# Patient Record
Sex: Female | Born: 1955 | Hispanic: No | Marital: Married
Health system: Southern US, Community
[De-identification: ages and names within clinical notes are randomized; demographics above are authoritative.]

## PROBLEM LIST (undated history)

## (undated) DIAGNOSIS — K760 Fatty (change of) liver, not elsewhere classified: Secondary | ICD-10-CM

## (undated) DIAGNOSIS — K3189 Other diseases of stomach and duodenum: Secondary | ICD-10-CM

## (undated) DIAGNOSIS — I5032 Chronic diastolic (congestive) heart failure: Secondary | ICD-10-CM

## (undated) DIAGNOSIS — J09X1 Influenza due to identified novel influenza A virus with pneumonia: Secondary | ICD-10-CM

## (undated) DIAGNOSIS — J9621 Acute and chronic respiratory failure with hypoxia: Secondary | ICD-10-CM

---

## 2018-09-28 ENCOUNTER — Inpatient Hospital Stay
Admit: 2018-09-28 | Discharge: 2018-10-26 | Disposition: A | Discharge status: Hospice | Payer: Medicare Other | Source: Other Acute Inpatient Hospital | Attending: Internal Medicine | Admitting: Internal Medicine

## 2018-09-28 ENCOUNTER — Other Ambulatory Visit (HOSPITAL_COMMUNITY): Payer: Medicare Other

## 2018-09-28 DIAGNOSIS — J09X1 Influenza due to identified novel influenza A virus with pneumonia: Secondary | ICD-10-CM | POA: Diagnosis present

## 2018-09-28 DIAGNOSIS — R195 Other fecal abnormalities: Secondary | ICD-10-CM

## 2018-09-28 DIAGNOSIS — K921 Melena: Secondary | ICD-10-CM

## 2018-09-28 DIAGNOSIS — K3189 Other diseases of stomach and duodenum: Secondary | ICD-10-CM | POA: Diagnosis present

## 2018-09-28 DIAGNOSIS — T17908A Unspecified foreign body in respiratory tract, part unspecified causing other injury, initial encounter: Secondary | ICD-10-CM

## 2018-09-28 DIAGNOSIS — Z931 Gastrostomy status: Secondary | ICD-10-CM

## 2018-09-28 DIAGNOSIS — Z930 Tracheostomy status: Secondary | ICD-10-CM

## 2018-09-28 DIAGNOSIS — Z992 Dependence on renal dialysis: Secondary | ICD-10-CM

## 2018-09-28 DIAGNOSIS — K760 Fatty (change of) liver, not elsewhere classified: Secondary | ICD-10-CM | POA: Diagnosis present

## 2018-09-28 DIAGNOSIS — I5032 Chronic diastolic (congestive) heart failure: Secondary | ICD-10-CM | POA: Diagnosis present

## 2018-09-28 DIAGNOSIS — R109 Unspecified abdominal pain: Secondary | ICD-10-CM

## 2018-09-28 DIAGNOSIS — D62 Acute posthemorrhagic anemia: Secondary | ICD-10-CM

## 2018-09-28 DIAGNOSIS — J9690 Respiratory failure, unspecified, unspecified whether with hypoxia or hypercapnia: Secondary | ICD-10-CM

## 2018-09-28 DIAGNOSIS — J9621 Acute and chronic respiratory failure with hypoxia: Secondary | ICD-10-CM | POA: Diagnosis present

## 2018-09-28 HISTORY — DX: Other diseases of stomach and duodenum: K31.89

## 2018-09-28 HISTORY — DX: Chronic diastolic (congestive) heart failure: I50.32

## 2018-09-28 HISTORY — DX: Acute and chronic respiratory failure with hypoxia: J96.21

## 2018-09-28 HISTORY — DX: Fatty (change of) liver, not elsewhere classified: K76.0

## 2018-09-28 HISTORY — DX: Influenza due to identified novel influenza A virus with pneumonia: J09.X1

## 2018-09-29 DIAGNOSIS — I5032 Chronic diastolic (congestive) heart failure: Secondary | ICD-10-CM | POA: Diagnosis not present

## 2018-09-29 DIAGNOSIS — R195 Other fecal abnormalities: Secondary | ICD-10-CM

## 2018-09-29 DIAGNOSIS — K3189 Other diseases of stomach and duodenum: Secondary | ICD-10-CM

## 2018-09-29 DIAGNOSIS — K760 Fatty (change of) liver, not elsewhere classified: Secondary | ICD-10-CM

## 2018-09-29 DIAGNOSIS — J09X1 Influenza due to identified novel influenza A virus with pneumonia: Secondary | ICD-10-CM

## 2018-09-29 DIAGNOSIS — D62 Acute posthemorrhagic anemia: Secondary | ICD-10-CM

## 2018-09-29 DIAGNOSIS — J9621 Acute and chronic respiratory failure with hypoxia: Secondary | ICD-10-CM

## 2018-09-29 DIAGNOSIS — K921 Melena: Secondary | ICD-10-CM

## 2018-09-29 LAB — CBC
HCT: 29.5 % — ABNORMAL LOW (ref 36.0–46.0)
HCT: 22.2 % — ABNORMAL LOW (ref 36.0–46.0)
Hemoglobin: 8.8 g/dL — ABNORMAL LOW (ref 12.0–15.0)
Hemoglobin: 6.7 g/dL — CL (ref 12.0–15.0)
MCH: 31.2 pg (ref 26.0–34.0)
MCH: 29.9 pg (ref 26.0–34.0)
MCHC: 29.8 g/dL — ABNORMAL LOW (ref 30.0–36.0)
MCHC: 30.2 g/dL (ref 30.0–36.0)
MCV: 104.6 fL — ABNORMAL HIGH (ref 80.0–100.0)
MCV: 99.1 fL (ref 80.0–100.0)
Platelets: 337 10*3/uL (ref 150–400)
Platelets: 361 10*3/uL (ref 150–400)
RBC: 2.82 MIL/uL — ABNORMAL LOW (ref 3.87–5.11)
RBC: 2.24 MIL/uL — ABNORMAL LOW (ref 3.87–5.11)
RDW: 20.7 % — ABNORMAL HIGH (ref 11.5–15.5)
RDW: 19.6 % — ABNORMAL HIGH (ref 11.5–15.5)
WBC: 21.4 10*3/uL — ABNORMAL HIGH (ref 4.0–10.5)
WBC: 16.4 10*3/uL — ABNORMAL HIGH (ref 4.0–10.5)
nRBC: 0.1 % (ref 0.0–0.2)
nRBC: 0 % (ref 0.0–0.2)

## 2018-09-29 LAB — APTT: aPTT: 30 seconds (ref 24–36)

## 2018-09-29 LAB — COMPREHENSIVE METABOLIC PANEL
ALT: 20 U/L (ref 0–44)
AST: 19 U/L (ref 15–41)
Albumin: 1.9 g/dL — ABNORMAL LOW (ref 3.5–5.0)
Alkaline Phosphatase: 336 U/L — ABNORMAL HIGH (ref 38–126)
Anion gap: 8 (ref 5–15)
BUN: 19 mg/dL (ref 8–23)
CO2: 26 mmol/L (ref 22–32)
Calcium: 8.1 mg/dL — ABNORMAL LOW (ref 8.9–10.3)
Chloride: 109 mmol/L (ref 98–111)
Creatinine, Ser: 0.65 mg/dL (ref 0.44–1.00)
GFR calc Af Amer: >60 mL/min (ref >60)
GFR calc non Af Amer: >60 mL/min (ref >60)
Glucose, Bld: 158 mg/dL — ABNORMAL HIGH (ref 70–99)
Potassium: 3.6 mmol/L (ref 3.5–5.1)
Sodium: 143 mmol/L (ref 135–145)
Total Bilirubin: 1.4 mg/dL — ABNORMAL HIGH (ref 0.3–1.2)
Total Protein: 5.3 g/dL — ABNORMAL LOW (ref 6.5–8.1)

## 2018-09-29 LAB — CBC WITH DIFFERENTIAL/PLATELET
Abs Immature Granulocytes: 0.51 10*3/uL — ABNORMAL HIGH (ref 0.00–0.07)
Basophils Absolute: 0 10*3/uL (ref 0.0–0.1)
Basophils Relative: 0 %
Eosinophils Absolute: 0.3 10*3/uL (ref 0.0–0.5)
Eosinophils Relative: 2 %
HCT: 22.4 % — ABNORMAL LOW (ref 36.0–46.0)
Hemoglobin: 7.1 g/dL — ABNORMAL LOW (ref 12.0–15.0)
Immature Granulocytes: 3 %
Lymphocytes Relative: 5 %
Lymphs Abs: 1 10*3/uL (ref 0.7–4.0)
MCH: 31.1 pg (ref 26.0–34.0)
MCHC: 31.7 g/dL (ref 30.0–36.0)
MCV: 98.2 fL (ref 80.0–100.0)
Monocytes Absolute: 1.6 10*3/uL — ABNORMAL HIGH (ref 0.1–1.0)
Monocytes Relative: 8 %
Neutro Abs: 16.4 10*3/uL — ABNORMAL HIGH (ref 1.7–7.7)
Neutrophils Relative %: 82 %
Platelets: 370 10*3/uL (ref 150–400)
RBC: 2.28 MIL/uL — ABNORMAL LOW (ref 3.87–5.11)
RDW: 20.2 % — ABNORMAL HIGH (ref 11.5–15.5)
WBC: 19.9 10*3/uL — ABNORMAL HIGH (ref 4.0–10.5)
nRBC: 0 % (ref 0.0–0.2)

## 2018-09-29 LAB — PHOSPHORUS: Phosphorus: 2.8 mg/dL (ref 2.5–4.6)

## 2018-09-29 LAB — PROTIME-INR
INR: 1.1 (ref 0.8–1.2)
Prothrombin Time: 14.5 seconds (ref 11.4–15.2)

## 2018-09-29 LAB — PREPARE RBC (CROSSMATCH)

## 2018-09-29 LAB — MAGNESIUM: Magnesium: 1.9 mg/dL (ref 1.7–2.4)

## 2018-09-29 LAB — C DIFFICILE QUICK SCREEN W PCR REFLEX
C Diff antigen: NEGATIVE
C Diff interpretation: NOT DETECTED
C Diff toxin: NEGATIVE

## 2018-09-29 LAB — ABO/RH: ABO/RH(D): A POS

## 2018-09-29 MED ORDER — IOHEXOL 350 MG/ML SOLN
30.0000 mL | Freq: Once | INTRAVENOUS | Status: AC | PRN
  Administered 2018-09-28: 30 mL

## 2018-09-29 NOTE — Consult Note (Signed)
Pulmonary [place]  PULMONARY SERVICE  Date of Service: 09/29/2018  PULMONARY CRITICAL CARE CONSULT   Pamela Curry  YIR:[id number]  DOB: 21-Feb-1956   DOA: 09/28/2018  Referring Physician: Merton Border, MD  HPI: Pamela Curry is a 63 y.o. female seen for follow up of Acute on Chronic Respiratory Failure.  Patient has multiple medical problems including respiratory failure urinary tract infections hepatic steatosis diastolic congestive heart failure presented to the hospital with bilateral pneumonia which was subsequently found out to be influenza A.  Patient progressed and actually worsened and ended up having to be intubated because of declining respiratory status.  Patient also developed renal failure and required CRRT.  Patient was found subsequently to have gastric perforation which required repair.  The CT scan had shown a positive leak.  Because of failure to come off the ventilator patient ended up having to have a tracheostomy done.  Subsequently has been started on T collar's.  The dialysis had also been stopped.  Review of Systems:  ROS performed and is unremarkable other than noted above.  Past medical history: Urinary tract infection Influenza Neck steatosis Chronic diastolic heart failure Gastric perforation  Past surgical history: Exploratory laparotomy Perforated gastric repair Tracheostomy  Social history: Unknown if ever smoked + alcohol or drug abuse  Family history: Unknown noncontributory to the present illness  Medications: Reviewed on Rounds  Physical Exam:  Vitals: Temperature 98.8 pulse 100 respiratory 23 blood pressure 157/85 saturations 97%  Ventilator Settings patient was on pressure support FiO2 28% tidal volume 550  . General: Comfortable at this time . Eyes: Grossly normal lids, irises & conjunctiva . ENT: grossly tongue is normal . Neck: no obvious mass . Cardiovascular: S1-S2 normal no gallop or  rub . Respiratory: No rhonchi no rales are noted . Abdomen: Nontender . Skin: no rash seen on limited exam . Musculoskeletal: not rigid . Psychiatric:unable to assess . Neurologic: no seizure no involuntary movements         Labs on Admission:  Basic Metabolic Panel: Recent Labs  Lab 09/29/18 0546  NA 143  K 3.6  CL 109  CO2 26  GLUCOSE 158*  BUN 19  CREATININE 0.65  CALCIUM 8.1*  MG 1.9  PHOS 2.8    No results for input(s): PHART, PCO2ART, PO2ART, HCO3, O2SAT in the last 168 hours.  Liver Function Tests: Recent Labs  Lab 09/29/18 0546  AST 19  ALT 20  ALKPHOS 336*  BILITOT 1.4*  PROT 5.3*  ALBUMIN 1.9*   No results for input(s): LIPASE, AMYLASE in the last 168 hours. No results for input(s): AMMONIA in the last 168 hours.  CBC: Recent Labs  Lab 09/29/18 0546 09/29/18 1027  WBC 19.9* 21.4*  NEUTROABS 16.4*  --   HGB 7.1* 8.8*  HCT 22.4* 29.5*  MCV 98.2 104.6*  PLT 370 337    Cardiac Enzymes: No results for input(s): CKTOTAL, CKMB, CKMBINDEX, TROPONINI in the last 168 hours.  BNP (last 3 results) No results for input(s): BNP in the last 8760 hours.  ProBNP (last 3 results) No results for input(s): PROBNP in the last 8760 hours.   Radiological Exams on Admission: Dg Abdomen Peg Tube Location  Result Date: 09/29/2018 CLINICAL DATA:  Tracheostomy in place. PEG status. EXAM: ABDOMEN - 1 VIEW COMPARISON:  None. FINDINGS: 30 cc Omnipaque administered through indwelling gastrostomy tube. Contrast opacifies small bowel in the lower abdomen. No evidence of extravasation or leak. Enteric chain sutures are noted.  Otherwise generalized paucity of bowel gas. Overlying monitoring devices. IMPRESSION: Contrast opacifies small bowel in the lower abdomen, suggesting this is a jejunostomy rather than a gastrostomy tube. No evidence of extravasation or leak. Recommend correlation with procedural history. Electronically Signed   By: Keith Rake M.D.   On: 09/29/2018  00:16   Dg Chest Port 1 View  Result Date: 09/29/2018 CLINICAL DATA:  Tracheostomy in place. PEG status. EXAM: PORTABLE CHEST 1 VIEW COMPARISON:  None. FINDINGS: Patient is rotated. Tracheostomy tube tip at the thoracic inlet. Right internal jugular central line in the upper SVC. Low lung volumes. Mild cardiomegaly with aortic tortuosity. Fine interstitial opacities throughout both lungs, slightly more prominent in the periphery of the right mid lung zone. No pneumothorax or large pleural effusion. IMPRESSION: 1. Tracheostomy tube tip at the thoracic inlet. Right internal jugular central line in the upper SVC. No pneumothorax. 2. Low lung volumes. Diffuse fine interstitial opacities may be due to underlying chronic lung disease or pulmonary edema. Slightly more confluent opacity in the periphery of the right mid lung is nonspecific, pneumonia is considered. Electronically Signed   By: Keith Rake M.D.   On: 09/29/2018 00:15    Assessment/Plan Active Problems:   Acute on chronic respiratory failure with hypoxia (HCC)   Influenza A with pneumonia   Hepatic steatosis   Chronic diastolic heart failure (HCC)   Gastric perforation, acute   1. Acute on chronic respiratory failure with hypoxia at this time patient is doing fine on pressure support wean with respiratory to continue to advance the weaning on pressure support and ultimately start T collar trials. 2. Community-acquired pneumonia which was actually influenza pneumonia ruled out continue with supportive care this has resolved.  Chest x-ray still showing some residual interstitial infiltrates 3. Hepatic steatosis patient does have a history of alcohol abuse we will continue to monitor labs 4. Chronic diastolic heart failure currently at baseline continue with supportive care 5. Gastric perforation status post surgery will continue with supportive care  I have personally seen and evaluated the patient, evaluated laboratory and imaging  results, formulated the assessment and plan and placed orders. The Patient requires high complexity decision making for assessment and support.  Case was discussed on Rounds with the Respiratory Therapy Staff Time Spent 74minutes  Allyne Gee, MD [facility] Pulmonary Critical Care Medicine Sleep Medicine

## 2018-09-30 ENCOUNTER — Encounter: Payer: Self-pay | Admitting: Internal Medicine

## 2018-09-30 DIAGNOSIS — K3189 Other diseases of stomach and duodenum: Secondary | ICD-10-CM | POA: Diagnosis not present

## 2018-09-30 DIAGNOSIS — K7460 Unspecified cirrhosis of liver: Secondary | ICD-10-CM

## 2018-09-30 DIAGNOSIS — I5032 Chronic diastolic (congestive) heart failure: Secondary | ICD-10-CM | POA: Diagnosis not present

## 2018-09-30 DIAGNOSIS — K760 Fatty (change of) liver, not elsewhere classified: Secondary | ICD-10-CM | POA: Diagnosis not present

## 2018-09-30 DIAGNOSIS — J09X1 Influenza due to identified novel influenza A virus with pneumonia: Secondary | ICD-10-CM | POA: Diagnosis present

## 2018-09-30 DIAGNOSIS — D649 Anemia, unspecified: Secondary | ICD-10-CM

## 2018-09-30 DIAGNOSIS — J9621 Acute and chronic respiratory failure with hypoxia: Secondary | ICD-10-CM | POA: Diagnosis present

## 2018-09-30 DIAGNOSIS — Z9049 Acquired absence of other specified parts of digestive tract: Secondary | ICD-10-CM

## 2018-09-30 LAB — CBC
HCT: 24.5 % — ABNORMAL LOW (ref 36.0–46.0)
HCT: 21.8 % — ABNORMAL LOW (ref 36.0–46.0)
Hemoglobin: 7.5 g/dL — ABNORMAL LOW (ref 12.0–15.0)
Hemoglobin: 7 g/dL — ABNORMAL LOW (ref 12.0–15.0)
MCH: 28.6 pg (ref 26.0–34.0)
MCH: 29.9 pg (ref 26.0–34.0)
MCHC: 30.6 g/dL (ref 30.0–36.0)
MCHC: 32.1 g/dL (ref 30.0–36.0)
MCV: 93.5 fL (ref 80.0–100.0)
MCV: 93.2 fL (ref 80.0–100.0)
Platelets: 359 10*3/uL (ref 150–400)
Platelets: 352 10*3/uL (ref 150–400)
RBC: 2.62 MIL/uL — ABNORMAL LOW (ref 3.87–5.11)
RBC: 2.34 MIL/uL — ABNORMAL LOW (ref 3.87–5.11)
RDW: 22.3 % — ABNORMAL HIGH (ref 11.5–15.5)
RDW: 22.6 % — ABNORMAL HIGH (ref 11.5–15.5)
WBC: 16.4 10*3/uL — ABNORMAL HIGH (ref 4.0–10.5)
WBC: 16.4 10*3/uL — ABNORMAL HIGH (ref 4.0–10.5)
nRBC: 0 % (ref 0.0–0.2)
nRBC: 0 % (ref 0.0–0.2)

## 2018-09-30 NOTE — Progress Notes (Addendum)
Pulmonary Critical Care Medicine [place]   PULMONARY CRITICAL CARE SERVICE  PROGRESS NOTE  Date of Service: 09/30/2018  Pamela Curry  OIN:[id number]  DOB: Jan 28, 1956   DOA: 09/28/2018  Referring Physician: Merton Border, MD  HPI: Pamela Curry is a 63 y.o. female seen for follow up of Acute on Chronic Respiratory Failure.  Patient remains on pressure support 12/5 FiO2 28%.  No acute distress noted.  Medications: Reviewed on Rounds  Physical Exam:  Vitals: Pulse 88 respirations 24 BP 160/80 O2 sat 98% temp 90.4  Ventilator Settings pressure support FiO2 28%  . General: Comfortable at this time . Eyes: Grossly normal lids, irises & conjunctiva . ENT: grossly tongue is normal . Neck: no obvious mass . Cardiovascular: S1 S2 normal no gallop . Respiratory: No rales or rhonchi noted . Abdomen: soft . Skin: no rash seen on limited exam . Musculoskeletal: not rigid . Psychiatric:unable to assess . Neurologic: no seizure no involuntary movements         Lab Data:   Basic Metabolic Panel: Recent Labs  Lab 09/29/18 0546  NA 143  K 3.6  CL 109  CO2 26  GLUCOSE 158*  BUN 19  CREATININE 0.65  CALCIUM 8.1*  MG 1.9  PHOS 2.8    ABG: No results for input(s): PHART, PCO2ART, PO2ART, HCO3, O2SAT in the last 168 hours.  Liver Function Tests: Recent Labs  Lab 09/29/18 0546  AST 19  ALT 20  ALKPHOS 336*  BILITOT 1.4*  PROT 5.3*  ALBUMIN 1.9*   No results for input(s): LIPASE, AMYLASE in the last 168 hours. No results for input(s): AMMONIA in the last 168 hours.  CBC: Recent Labs  Lab 09/29/18 0546 09/29/18 1027 09/29/18 1642 09/30/18 0640 09/30/18 0955  WBC 19.9* 21.4* 16.4* 16.4* 16.4*  NEUTROABS 16.4*  --   --   --   --   HGB 7.1* 8.8* 6.7* 7.5* 7.0*  HCT 22.4* 29.5* 22.2* 24.5* 21.8*  MCV 98.2 104.6* 99.1 93.5 93.2  PLT 370 337 361 359 352    Cardiac Enzymes: No results for input(s): CKTOTAL, CKMB, CKMBINDEX, TROPONINI in the  last 168 hours.  BNP (last 3 results) No results for input(s): BNP in the last 8760 hours.  ProBNP (last 3 results) No results for input(s): PROBNP in the last 8760 hours.  Radiological Exams: Dg Abdomen Peg Tube Location  Result Date: 09/29/2018 CLINICAL DATA:  Tracheostomy in place. PEG status. EXAM: ABDOMEN - 1 VIEW COMPARISON:  None. FINDINGS: 30 cc Omnipaque administered through indwelling gastrostomy tube. Contrast opacifies small bowel in the lower abdomen. No evidence of extravasation or leak. Enteric chain sutures are noted. Otherwise generalized paucity of bowel gas. Overlying monitoring devices. IMPRESSION: Contrast opacifies small bowel in the lower abdomen, suggesting this is a jejunostomy rather than a gastrostomy tube. No evidence of extravasation or leak. Recommend correlation with procedural history. Electronically Signed   By: Keith Rake M.D.   On: 09/29/2018 00:16   Dg Chest Port 1 View  Result Date: 09/29/2018 CLINICAL DATA:  Tracheostomy in place. PEG status. EXAM: PORTABLE CHEST 1 VIEW COMPARISON:  None. FINDINGS: Patient is rotated. Tracheostomy tube tip at the thoracic inlet. Right internal jugular central line in the upper SVC. Low lung volumes. Mild cardiomegaly with aortic tortuosity. Fine interstitial opacities throughout both lungs, slightly more prominent in the periphery of the right mid lung zone. No pneumothorax or large pleural effusion. IMPRESSION: 1. Tracheostomy tube tip at the thoracic inlet. Right  internal jugular central line in the upper SVC. No pneumothorax. 2. Low lung volumes. Diffuse fine interstitial opacities may be due to underlying chronic lung disease or pulmonary edema. Slightly more confluent opacity in the periphery of the right mid lung is nonspecific, pneumonia is considered. Electronically Signed   By: Keith Rake M.D.   On: 09/29/2018 00:15    Assessment/Plan Active Problems:   Acute on chronic respiratory failure with hypoxia  (HCC)   Influenza A with pneumonia   Hepatic steatosis   Chronic diastolic heart failure (HCC)   Gastric perforation, acute   1. Acute on chronic respiratory failure with hypoxia patient is doing well on pressure support will continue weaning per protocol.  Continue Pulmicort and secretion management 2. Community-acquired pneumonia continue supportive measures at this time.  Continue to follow with x-rays. 3. Hepatic steatosis without history of alcohol abuse.  Continue to monitor labs 4. Chronic diastolic heart failure currently at baseline continue supportive care 5. Gastric perforation post surgery, continue supportive care   I have personally seen and evaluated the patient, evaluated laboratory and imaging results, formulated the assessment and plan and placed orders. The Patient requires high complexity decision making for assessment and support.  Case was discussed on Rounds with the Respiratory Therapy Staff  Allyne Gee, MD [facility] Pulmonary Critical Care Medicine Sleep Medicine

## 2018-09-30 NOTE — Consult Note (Addendum)
[facility] Consult: 11:06 AM 09/30/2018  LOS: 0 days    Referring Provider: Dr Pamela Curry at [facility]  Primary Care Physician:  System, Pcp Not In Primary Gastroenterologist:  unassigned  Husband Pamela Curry [phone].     Reason for Consultation:  GI bleed, bleeding at G tube and into ostomy   HPI: Pamela Curry is a 63 y.o. female.  Lives in [facility]s medical history includes Roux-en-Y gastrojejunostomy.  Hypothyroidism.  Anxiety, depression, chronic pain syndrome.  Arthritis.  Hypertension.  GERD.  Gallbladder stones. DM.  Morbid obesity  Admission at [facility] in [place] 09/02/2018 through 09/28/2018 with gastric perforation.  Multiple problems including acute respiratory failure requiring intubation and ultimately tracheostomy.  Influenza A community-acquired pneumonia.  UTI.  Ultrasound showed hepatomegaly, diffuse hepatic steatosis and cirrhotic appearing liver at laparotomy in obese patient with hx acohol abuse, reported abstinent since February 2019.  Underwent 09/09/2018 emergency exploratory laparotomy with lysis of adhesions, repair of gastric perforation, small bowel resection, right hemicolectomy with ileocolonic anastomosis, J-tube placement, removal of abdominal mesh and abdominal washout.  Went back to the OR on 3/27 for ex lap with splenic flexure takedown, colon resection with Hartman's pouch, ileostomy, jejunostomy revision, wound closure with biologic mesh, omentectomy. Required transfusions with 6 or 7 units of PRBCs, 2 of FFP during the hospital stay. Thrombocytopenia resolved.  As a result of her surgery she had potential short gut syndrome with severe calorie deficiency. Required CRRT for renal failure. Tolerating tube feeds at the time of discharge. Final CTs showed no  abscesses. Discharged on famotidine 20 mg twice daily Just prior to discharge a right and left lower abdomen JP drains were removed.  She still has JP drains in her bilateral lower abdomen   Ad[facility]28[facility]   [facility] was blood coming out of her ileostomy when she got here. This persists. She also has serosanguineous discharge to the right JP drain.  Nurse today reports that she gave her "po" meds and that they very quickly emerged through the ileostomy. Drifting Hgb requiring PRBC since admission.  Blood noted at G tube and ostomy.   Hgb 7.1 >> 6.7 >> 1 PRBC>> 7.  Got 1 U PRBC late yesterday.     PT/INR normal.  Platelets 350.  No renal insufficiency.   T bili 1.4.  Alk phos 336.  Transaminases normal. Stool C diff negative.   Contrast imaging via G tube:suggests jejunostomy rather than a gastrostomy tube. No evidence of extravasation or leak.    Patient denies abdominal pain.    Past medical and past surgical history as above in the HPI.  Allergies include morphine, codeine, hydrocodone, propoxyphene, sulfamethoxazole, trimethoprim, tramadol, trazodone.  Scheduled Meds: Current medications include: Humalog insulin, digestive advantage caps.  Fluoxetine.  Levothyroxine.  Metoprolol.  Pro-stat protein supplement.  Protonix 40 mg IV twice daily which was started on 09/29/2018.  Refresh solution tears.  Multivitamin.  Vitamin B12 p.o. vital tube feedings at 20 mL's/hour   No  family history on file.   Not able to obtain this information.  Social History   Socioeconomic History  . Marital status: Married    Spouse name: Not on file  . Number of children: Not on file  . Years of education: Not on file  . Highest education level: Not on file  Occupational History  . Not on file  Social Needs  . Financial resource strain: Not on file  . Food insecurity:    Worry: Not on file    Inability: Not on file  . Transportation needs:     Medical: Not on file    Non-medical: Not on file  Tobacco Use  . Smoking status: Not on file  Substance and Sexual Activity  . Alcohol use: Not on file  . Drug use: Not on file  . Sexual activity: Not on file  Lifestyle  . Physical activity:    Days per week: Not on file    Minutes per session: Not on file  . Stress: Not on file  Relationships  . Social connections:    Talks on phone: Not on file    Gets together: Not on file    Attends religious service: Not on file    Active member of club or organization: Not on file    Attends meetings of clubs or organizations: Not on file    Relationship status: Not on file  . Intimate partner violence:    Fear of current or ex partner: Not on file    Emotionally abused: Not on file    Physically abused: Not on file    Forced sexual activity: Not on file  Other Topics Concern  . Not on file  Social History Narrative  . Not on file    REVIEW OF SYSTEMS: Constitutional: Bedridden ENT:  No nose bleeds Pulm: Remains on vent/trach. CV:  No palpitations, no LE edema.  GU:  No hematuria, no frequency GI: Per HPI. Heme: Per HPI. Transfusions: Per HPI. Neuro:  No headaches, no peripheral tingling or numbness Derm:  No itching, no rash or sores.  Endocrine:  No sweats or chills.  No polyuria or dysuria Immunization: Unknown. Travel:  None beyond local counties in last few months.    PHYSICAL EXAM: 109 kg.  Temperature 98.4.  Heart rate 96.  Respirations 22.  Blood pressure 125/95.  Saturation 99%. General: Pamela Curry obese, chronically ill WF.  Appears younger than stated age. Head: No facial asymmetry or signs of head trauma. Eyes: No scleral icterus, no conjunctival pallor. Ears: Hearing appears intact. Nose: No congestion or discharge. Mouth: No blood per office. Neck: Tracheostomy in place.  No obvious bleeding or discharge. Lungs: Nonlabored breathing, on vent. Heart: RRR.  No MRG. Abdomen: Soft.  Obese.  The ileostomy contains  and is nearly full with brownish, trace reddish material.  Does not look overtly bloody.  JP drain from the right lower quadrant is serosanguineous and contains sediment.  JP drain in the right lower quadrant contains amber, sedimentatious discharge.   A very long midline abdominal scar is crusted with dried blood and staples are still in place.  It is intact.  There is no active bleeding of the wound. Rectal: Did not perform. Musc/Skeltl: No gross joint deformity. Extremities: Edema/anasarca on the limbs. Neurologic: Consistently follows commands.  Not able to speak with a trach in place. Skin: Some bruises on the arms. Nodes: No cervical adenopathy. Psych: Calm, not agitated.  Intake/Output from previous day: No intake/output data  recorded. Intake/Output this shift: No intake/output data recorded.  LAB RESULTS: Recent Labs    09/29/18 1642 09/30/18 0640 09/30/18 0955  WBC 16.4* 16.4* 16.4*  HGB 6.7* 7.5* 7.0*  HCT 22.2* 24.5* 21.8*  PLT 361 359 352   BMET Lab Results  Component Value Date   NA 143 09/29/2018   K 3.6 09/29/2018   CL 109 09/29/2018   CO2 26 09/29/2018   GLUCOSE 158 (H) 09/29/2018   BUN 19 09/29/2018   CREATININE 0.65 09/29/2018   CALCIUM 8.1 (L) 09/29/2018   LFT Recent Labs    09/29/18 0546  PROT 5.3*  ALBUMIN 1.9*  AST 19  ALT 20  ALKPHOS 336*  BILITOT 1.4*   PT/INR Lab Results  Component Value Date   INR 1.1 09/29/2018   Hepatitis Panel No results for input(s): HEPBSAG, HCVAB, HEPAIGM, HEPBIGM in the last 72 hours. C-Diff No components found for: CDIFF Lipase  No results found for: LIPASE  Drugs of Abuse  No results found for: LABOPIA, COCAINSCRNUR, LABBENZ, AMPHETMU, THCU, LABBARB   RADIOLOGY STUDIES: Dg Abdomen Peg Tube Location  Result Date: 09/29/2018 CLINICAL DATA:  Tracheostomy in place. PEG status. EXAM: ABDOMEN - 1 VIEW COMPARISON:  None. FINDINGS: 30 cc Omnipaque administered through indwelling gastrostomy tube. Contrast  opacifies small bowel in the lower abdomen. No evidence of extravasation or leak. Enteric chain sutures are noted. Otherwise generalized paucity of bowel gas. Overlying monitoring devices. IMPRESSION: Contrast opacifies small bowel in the lower abdomen, suggesting this is a jejunostomy rather than a gastrostomy tube. No evidence of extravasation or leak. Recommend correlation with procedural history. Electronically Signed   By: Keith Rake M.D.   On: 09/29/2018 00:16   Dg Chest Port 1 View  Result Date: 09/29/2018 CLINICAL DATA:  Tracheostomy in place. PEG status. EXAM: PORTABLE CHEST 1 VIEW COMPARISON:  None. FINDINGS: Patient is rotated. Tracheostomy tube tip at the thoracic inlet. Right internal jugular central line in the upper SVC. Low lung volumes. Mild cardiomegaly with aortic tortuosity. Fine interstitial opacities throughout both lungs, slightly more prominent in the periphery of the right mid lung zone. No pneumothorax or large pleural effusion. IMPRESSION: 1. Tracheostomy tube tip at the thoracic inlet. Right internal jugular central line in the upper SVC. No pneumothorax. 2. Low lung volumes. Diffuse fine interstitial opacities may be due to underlying chronic lung disease or pulmonary edema. Slightly more confluent opacity in the periphery of the right mid lung is nonspecific, pneumonia is considered. Electronically Signed   By: Keith Rake M.D.   On: 09/29/2018 00:15      IMPRESSION:   *    Transfusion requiring anemia. Ports of blood per ileostomy but no gross blood by my exam today.  Serosanguineous discharge from the right lower quadrant JP drain.  *    Complicated postsurgical patient with initial surgery for gastric perforation in a patient with prior gastrojejunostomy. Initial surgery involved 2 hours LOA, gastric perforation repair, SB resection, right hemicolectomy/ileocolonic anastomosis, J-tube placement, mesh removal. Redo surgery 8 days later involved takedown of  splenic flexure, colon resection with Hartman's pouch, ileostomy, jejunostomy revision and mesh closure of the wound.  *    VDRF, status post tracheostomy.  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  T bili, alkaline phosphatase elevated.  *     Protein malnutrition.   PLAN:     *    Per attending GI MD. ?  Pursue EGD and/or endoscopic evaluation via ileostomy?  Azucena Freed  09/30/2018, 11:06 AM Phone [phone]

## 2018-10-01 ENCOUNTER — Encounter (HOSPITAL_COMMUNITY): Payer: Self-pay | Admitting: Certified Registered Nurse Anesthetist

## 2018-10-01 ENCOUNTER — Encounter
Disposition: A | Discharge status: Hospice | Payer: Self-pay | Source: Other Acute Inpatient Hospital | Attending: Internal Medicine

## 2018-10-01 DIAGNOSIS — J9621 Acute and chronic respiratory failure with hypoxia: Secondary | ICD-10-CM | POA: Diagnosis not present

## 2018-10-01 DIAGNOSIS — K3189 Other diseases of stomach and duodenum: Secondary | ICD-10-CM | POA: Diagnosis not present

## 2018-10-01 DIAGNOSIS — K760 Fatty (change of) liver, not elsewhere classified: Secondary | ICD-10-CM | POA: Diagnosis not present

## 2018-10-01 DIAGNOSIS — I5032 Chronic diastolic (congestive) heart failure: Secondary | ICD-10-CM | POA: Diagnosis not present

## 2018-10-01 LAB — CBC
HCT: 19.2 % — ABNORMAL LOW (ref 36.0–46.0)
HCT: 25 % — ABNORMAL LOW (ref 36.0–46.0)
HCT: 26.1 % — ABNORMAL LOW (ref 36.0–46.0)
Hemoglobin: 6.1 g/dL — CL (ref 12.0–15.0)
Hemoglobin: 7.9 g/dL — ABNORMAL LOW (ref 12.0–15.0)
Hemoglobin: 8.8 g/dL — ABNORMAL LOW (ref 12.0–15.0)
MCH: 29.9 pg (ref 26.0–34.0)
MCH: 28.3 pg (ref 26.0–34.0)
MCH: 29.7 pg (ref 26.0–34.0)
MCHC: 31.8 g/dL (ref 30.0–36.0)
MCHC: 31.6 g/dL (ref 30.0–36.0)
MCHC: 33.7 g/dL (ref 30.0–36.0)
MCV: 94.1 fL (ref 80.0–100.0)
MCV: 89.6 fL (ref 80.0–100.0)
MCV: 88.2 fL (ref 80.0–100.0)
Platelets: 354 10*3/uL (ref 150–400)
Platelets: 362 10*3/uL (ref 150–400)
Platelets: 345 10*3/uL (ref 150–400)
RBC: 2.04 MIL/uL — ABNORMAL LOW (ref 3.87–5.11)
RBC: 2.79 MIL/uL — ABNORMAL LOW (ref 3.87–5.11)
RBC: 2.96 MIL/uL — ABNORMAL LOW (ref 3.87–5.11)
RDW: 22 % — ABNORMAL HIGH (ref 11.5–15.5)
RDW: 20.3 % — ABNORMAL HIGH (ref 11.5–15.5)
RDW: 19.7 % — ABNORMAL HIGH (ref 11.5–15.5)
WBC: 15.9 10*3/uL — ABNORMAL HIGH (ref 4.0–10.5)
WBC: 16.8 10*3/uL — ABNORMAL HIGH (ref 4.0–10.5)
WBC: 15 10*3/uL — ABNORMAL HIGH (ref 4.0–10.5)
nRBC: 0 % (ref 0.0–0.2)
nRBC: 0 % (ref 0.0–0.2)
nRBC: 0 % (ref 0.0–0.2)

## 2018-10-01 LAB — BRAIN NATRIURETIC PEPTIDE: B Natriuretic Peptide: 593.8 pg/mL — ABNORMAL HIGH (ref 0.0–100.0)

## 2018-10-01 LAB — PREPARE RBC (CROSSMATCH)

## 2018-10-01 SURGERY — CANCELLED PROCEDURE

## 2018-10-01 NOTE — Progress Notes (Addendum)
Daily Rounding Note  10/01/2018, 10:53 AM  LOS: 0 days   SUBJECTIVE:   Chief complaint: anemia, dark material into ileostomy pouch.    Pt denies pain and nausea.   Material in ostomy tube is again burgundy toned.    OBJECTIVE:         Vital signs in last 24 hours:    BP: ()/()  Arterial Line BP: ()/()   T 98.3 F   HR 97   BP 131/62   resps 19.   General: obese, ill looking.     Heart: RRR    Chest: clear bil.   Abdomen: soft, NT, hypoactive BS.  Ostomy bag liquid with burgundy/deep brown coloration.  Pale pinkish ser-sanguinous fluid in R JP, amber/pale brown fluid in L JP  Extremities: edema with bullous changes in LE Neuro/Psych:  Pleasant, calm, follows commands.  Nods appropriately.  Unable to speak with her trach in place.    Intake/Output from previous day: No intake/output data recorded.  Intake/Output this shift: No intake/output data recorded.  Lab Results: Recent Labs    09/30/18 0640 09/30/18 0955 10/01/18 0600  WBC 16.4* 16.4* 15.9*  HGB 7.5* 7.0* 6.1*  HCT 24.5* 21.8* 19.2*  PLT 359 352 354   BMET Recent Labs    09/29/18 0546  NA 143  K 3.6  CL 109  CO2 26  GLUCOSE 158*  BUN 19  CREATININE 0.65  CALCIUM 8.1*   LFT Recent Labs    09/29/18 0546  PROT 5.3*  ALBUMIN 1.9*  AST 19  ALT 20  ALKPHOS 336*  BILITOT 1.4*   PT/INR Recent Labs    09/29/18 0546  LABPROT 14.5  INR 1.1   Hepatitis Panel No results for input(s): HEPBSAG, HCVAB, HEPAIGM, HEPBIGM in the last 72 hours.  Studies/Results: No results found.   Meds: On IV Protonix BID.  TF running at 20 ml/hour.     ASSESMENT:   *    Transfusion requiring anemia. Reports of blood per ileostomy but no gross blood on exam by GI 4/9.  Serosanguineous discharge from the right lower quadrant JP drain. Hgb 7.5 >> 7 >> 6.1 over 24 hours.  2 U PRBC ordered (given blood shortage likely will only get 1 U).  Last previous PRBC on  4/8.    *    Complicated postsurgical pt.  S/p surgery for gastric perforation in a patient with prior gastrojejunostomy. Initial surgery involved 2 hours LOA, gastric perforation repair, SB resection, right hemicolectomy/ileocolonic anastomosis, J-tube placement, mesh removal. Redo surgery 8 days later involved takedown of splenic flexure, colon resection with Hartman's pouch, ileostomy, jejunostomy revision and mesh closure of the wound.  *    VDRF, status post tracheostomy.  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  T bili, alkaline phosphatase elevated.  *     Protein malnutrition.  On J tube feeds.    PLAN   *   No plans for endoscopic eval today.  Dr Scarlette Shorts has assumed GI sevice for next few days.  Please see his comments as outlined below.  Thank you.        Azucena Freed  10/01/2018, 10:53 AM Phone [phone]  GI ATTENDING  Extremely complicated Case discussed with Dr. Rush Landmark.  Interval history and data reviewed.  Agree with interval progress note as outlined above.  Patient appears to be having some intermittent minor GI bleeding in her  surgically altered gut.  This may be due to anastomotic ulceration (multiple anastomosis above the ostomy).  She is on PPI for upper GI mucosal protection.  I am somewhat apprehensive to perform endoscopy given the recent nature of her extensive surgery.  Would continue with IV PPI therapy and transfusions.  We will continue to monitor ostomy output closely as well as blood counts.  If it appears that bleeding is persistent we would consider upper endoscopy.  Not clear if we will identify a lesion that is amenable to endoscopic therapy, but we will see.  If she were to develop significant drop in hemoglobin without overt GI bleeding, would consider CT scan of the abdomen and pelvis to rule out any intra-abdominal problem.  We will continue to follow.  Docia Chuck. Geri Seminole., M.D. [facility] Division of Gastroenterology

## 2018-10-01 NOTE — Progress Notes (Addendum)
Pulmonary Critical Care Medicine [name]   PULMONARY CRITICAL CARE SERVICE  PROGRESS NOTE  Date of Service: 10/01/2018  Pamela Curry  EVO:[id number]  DOB: 10-Dec-1955   DOA: 09/28/2018  Referring Physician: Merton Border, MD  HPI: Pamela Curry is a 63 y.o. female seen for follow up of Acute on Chronic Respiratory Failure.  Patient continues on aerosol trach collar 28% FiO2 with a 2-hour goal today.  When not on trach collar patient rest on pressure support with an O2 28%.  Medications: Reviewed on Rounds  Physical Exam:  Vitals: Pulse 98 respirations 16 BP 126/81 O2 sat 100% temp 98.6  Ventilator Settings ATC 28% 2 hours then pressure support 10/5 FiO2 28%.  . General: Comfortable at this time . Eyes: Grossly normal lids, irises & conjunctiva . ENT: grossly tongue is normal . Neck: no obvious mass . Cardiovascular: S1 S2 normal no gallop . Respiratory: No rales or rhonchi noted . Abdomen: soft . Skin: no rash seen on limited exam . Musculoskeletal: not rigid . Psychiatric:unable to assess . Neurologic: no seizure no involuntary movements         Lab Data:   Basic Metabolic Panel: Recent Labs  Lab 09/29/18 0546  NA 143  K 3.6  CL 109  CO2 26  GLUCOSE 158*  BUN 19  CREATININE 0.65  CALCIUM 8.1*  MG 1.9  PHOS 2.8    ABG: No results for input(s): PHART, PCO2ART, PO2ART, HCO3, O2SAT in the last 168 hours.  Liver Function Tests: Recent Labs  Lab 09/29/18 0546  AST 19  ALT 20  ALKPHOS 336*  BILITOT 1.4*  PROT 5.3*  ALBUMIN 1.9*   No results for input(s): LIPASE, AMYLASE in the last 168 hours. No results for input(s): AMMONIA in the last 168 hours.  CBC: Recent Labs  Lab 09/29/18 0546  09/29/18 1642 09/30/18 0640 09/30/18 0955 10/01/18 0600 10/01/18 1333  WBC 19.9*   < > 16.4* 16.4* 16.4* 15.9* 16.8*  NEUTROABS 16.4*  --   --   --   --   --   --   HGB 7.1*   < > 6.7* 7.5* 7.0* 6.1* 7.9*  HCT 22.4*   < > 22.2* 24.5* 21.8*  19.2* 25.0*  MCV 98.2   < > 99.1 93.5 93.2 94.1 89.6  PLT 370   < > 361 359 352 354 362   < > = values in this interval not displayed.    Cardiac Enzymes: No results for input(s): CKTOTAL, CKMB, CKMBINDEX, TROPONINI in the last 168 hours.  BNP (last 3 results) Recent Labs    10/01/18 0600  BNP 593.8*    ProBNP (last 3 results) No results for input(s): PROBNP in the last 8760 hours.  Radiological Exams: No results found.  Assessment/Plan Active Problems:   Acute on chronic respiratory failure with hypoxia (HCC)   Influenza A with pneumonia   Hepatic steatosis   Chronic diastolic heart failure (HCC)   Gastric perforation, acute   1. Acute on chronic respiratory with hypoxia doing well on pressure support and will continue weaning.  Patient's ATC goal days 2 hours.  Continue pulmonary toilet and secretion management 2. Community-acquired pneumonia continue with admission at this time 3. Hepatic steatosis without history of alcohol abuse continue to monitor labs 4. Chronic diastolic heart failure currently at baseline continue supportive care 5. Gastric perforation post surgery continue supportive measures   I have personally seen and evaluated the patient, evaluated laboratory and imaging results, formulated  the assessment and plan and placed orders. The Patient requires high complexity decision making for assessment and support.  Case was discussed on Rounds with the Respiratory Therapy Staff  Allyne Gee, MD [facility] Pulmonary Critical Care Medicine Sleep Medicine

## 2018-10-02 DIAGNOSIS — K3189 Other diseases of stomach and duodenum: Secondary | ICD-10-CM | POA: Diagnosis not present

## 2018-10-02 DIAGNOSIS — R195 Other fecal abnormalities: Secondary | ICD-10-CM

## 2018-10-02 DIAGNOSIS — K921 Melena: Secondary | ICD-10-CM

## 2018-10-02 DIAGNOSIS — D62 Acute posthemorrhagic anemia: Secondary | ICD-10-CM

## 2018-10-02 DIAGNOSIS — K760 Fatty (change of) liver, not elsewhere classified: Secondary | ICD-10-CM | POA: Diagnosis not present

## 2018-10-02 DIAGNOSIS — I5032 Chronic diastolic (congestive) heart failure: Secondary | ICD-10-CM | POA: Diagnosis not present

## 2018-10-02 DIAGNOSIS — J9621 Acute and chronic respiratory failure with hypoxia: Secondary | ICD-10-CM | POA: Diagnosis not present

## 2018-10-02 LAB — CBC
HCT: 25.8 % — ABNORMAL LOW (ref 36.0–46.0)
HCT: 25.6 % — ABNORMAL LOW (ref 36.0–46.0)
HCT: 26.5 % — ABNORMAL LOW (ref 36.0–46.0)
HCT: 22.3 % — ABNORMAL LOW (ref 36.0–46.0)
Hemoglobin: 8.6 g/dL — ABNORMAL LOW (ref 12.0–15.0)
Hemoglobin: 8.3 g/dL — ABNORMAL LOW (ref 12.0–15.0)
Hemoglobin: 8.5 g/dL — ABNORMAL LOW (ref 12.0–15.0)
Hemoglobin: 7.4 g/dL — ABNORMAL LOW (ref 12.0–15.0)
MCH: 29.6 pg (ref 26.0–34.0)
MCH: 28.8 pg (ref 26.0–34.0)
MCH: 28.8 pg (ref 26.0–34.0)
MCH: 30.2 pg (ref 26.0–34.0)
MCHC: 33.3 g/dL (ref 30.0–36.0)
MCHC: 32.4 g/dL (ref 30.0–36.0)
MCHC: 32.1 g/dL (ref 30.0–36.0)
MCHC: 33.2 g/dL (ref 30.0–36.0)
MCV: 88.7 fL (ref 80.0–100.0)
MCV: 88.9 fL (ref 80.0–100.0)
MCV: 89.8 fL (ref 80.0–100.0)
MCV: 91 fL (ref 80.0–100.0)
Platelets: 346 10*3/uL (ref 150–400)
Platelets: 351 10*3/uL (ref 150–400)
Platelets: 359 10*3/uL (ref 150–400)
Platelets: 384 10*3/uL (ref 150–400)
RBC: 2.91 MIL/uL — ABNORMAL LOW (ref 3.87–5.11)
RBC: 2.88 MIL/uL — ABNORMAL LOW (ref 3.87–5.11)
RBC: 2.95 MIL/uL — ABNORMAL LOW (ref 3.87–5.11)
RBC: 2.45 MIL/uL — ABNORMAL LOW (ref 3.87–5.11)
RDW: 20 % — ABNORMAL HIGH (ref 11.5–15.5)
RDW: 20 % — ABNORMAL HIGH (ref 11.5–15.5)
RDW: 19.9 % — ABNORMAL HIGH (ref 11.5–15.5)
RDW: 19.5 % — ABNORMAL HIGH (ref 11.5–15.5)
WBC: 14.8 10*3/uL — ABNORMAL HIGH (ref 4.0–10.5)
WBC: 14 10*3/uL — ABNORMAL HIGH (ref 4.0–10.5)
WBC: 14.6 10*3/uL — ABNORMAL HIGH (ref 4.0–10.5)
WBC: 18.2 10*3/uL — ABNORMAL HIGH (ref 4.0–10.5)
nRBC: 0 % (ref 0.0–0.2)
nRBC: 0 % (ref 0.0–0.2)
nRBC: 0 % (ref 0.0–0.2)
nRBC: 0 % (ref 0.0–0.2)

## 2018-10-02 LAB — TYPE AND SCREEN
ABO/RH(D): A POS
Antibody Screen: NEGATIVE
Unit division: 0
Unit division: 0
Unit division: 0

## 2018-10-02 LAB — BASIC METABOLIC PANEL
Anion gap: 11 (ref 5–15)
BUN: 19 mg/dL (ref 8–23)
CO2: 30 mmol/L (ref 22–32)
Calcium: 8.1 mg/dL — ABNORMAL LOW (ref 8.9–10.3)
Chloride: 103 mmol/L (ref 98–111)
Creatinine, Ser: 0.61 mg/dL (ref 0.44–1.00)
GFR calc Af Amer: >60 mL/min (ref >60)
GFR calc non Af Amer: >60 mL/min (ref >60)
Glucose, Bld: 103 mg/dL — ABNORMAL HIGH (ref 70–99)
Potassium: 3.2 mmol/L — ABNORMAL LOW (ref 3.5–5.1)
Sodium: 144 mmol/L (ref 135–145)

## 2018-10-02 LAB — BPAM RBC
Blood Product Expiration Date: 202004172359
Blood Product Expiration Date: 202004172359
Blood Product Expiration Date: 202004182359
ISSUE DATE / TIME: 202004082307
ISSUE DATE / TIME: 202004100818
ISSUE DATE / TIME: 202004101619
Unit Type and Rh: 600
Unit Type and Rh: 6200
Unit Type and Rh: 6200

## 2018-10-02 NOTE — Progress Notes (Addendum)
     [facility] Progress Note   Chief Complaint:   GI bleed   ASSESSMENT AND PLAN:   76. 63 yo female with GI bleeding / anemia requiring transfusion. Her ileostomy output consists of brownish black fluid. She has a very complex medical -surgical history as outlined in our consult note yesterday. In March she had repair of gastric perforation, small bowel resection, right hemicolectomy with ileocolonic anastomosis this was followed several days later by splenic flexure takedown, colon resection with Hartman's pouch, ileostomy, jejunostomy revision, omentectomy.  She still has JP drains in LLQ (blood tinged)  and RLQ (serosanguineous).  - received 2 additional uRPBC today, hgb now up to 8.5.  -At some point she may require EGD and / or ileoscopy.   2. Cirrhosis, noted by Surgeon.    OBJECTIVE:     Vital signs in last 24 hours:   temp 981 - HR 98- Resp 25 - BP 121/74 General:   Alert, obese female EENT:  Normal hearing, non icteric sclera, conjunctive pink.  Heart:  Regular rate and rhythm  Pulm: Normal respiratory effort, no wheezes in chest. Trach collar on  Abdomen:  Soft, nondistended, nontender.  Normal bowel sounds Neurologic:  Alert  Psych:  Pleasant, cooperative.  Normal mood and affect.   Intake/Output from previous day: No intake/output data recorded. Intake/Output this shift: No intake/output data recorded.  Lab Results: Recent Labs    10/02/18 0053 10/02/18 0841 10/02/18 1447  WBC 14.8* 14.0* 14.6*  HGB 8.6* 8.3* 8.5*  HCT 25.8* 25.6* 26.5*  PLT 346 351 359   BMET Recent Labs    10/02/18 0841  NA 144  K 3.2*  CL 103  CO2 30  GLUCOSE 103*  BUN 19  CREATININE 0.61  CALCIUM 8.1*    Active Problems:   Acute on chronic respiratory failure with hypoxia (HCC)   Influenza A with pneumonia   Hepatic steatosis   Chronic diastolic heart failure (HCC)   Gastric perforation, acute     LOS: 0 days   Tye Savoy ,NP 10/02/2018, 3:42  PM  GI ATTENDING  Interval history data reviewed.  Patient seen and examined.  Agree with interval progress note as outlined above.  Patient without evidence of significant GI bleeding.  Ostomy output brownish/blackish liquidy material.  Not melena.  Hemoglobin has been stable.  Continue high-level supportive care.  We will follow.  Docia Chuck. Geri Seminole., M.D. [facility] Division of Gastroenterology

## 2018-10-02 NOTE — Progress Notes (Addendum)
Pulmonary Critical Care Medicine [place]   PULMONARY CRITICAL CARE SERVICE  PROGRESS NOTE  Date of Service: 10/02/2018  Pamela Curry  TGG:[id number]  DOB: 1955/07/27   DOA: 09/28/2018  Referring Physician: Merton Border, MD  HPI: Pamela Curry is a 63 y.o. female seen for follow up of Acute on Chronic Respiratory Failure.  Patient has a 4-hour goal today on 28% aerosol trach collar.  Was able to do 2 hours yesterday with no difficulty.  Does have a large amount of secretions.  Remains afebrile.  Medications: Reviewed on Rounds  Physical Exam:  Vitals: Pulse 97 serrations 22 BP 140/79 O2 sat 100% temp 90.7  Ventilator Settings aerosol trach collar for 8%  . General: Comfortable at this time . Eyes: Grossly normal lids, irises & conjunctiva . ENT: grossly tongue is normal . Neck: no obvious mass . Cardiovascular: S1 S2 normal no gallop . Respiratory: No rales rhonchi noted . Abdomen: soft . Skin: no rash seen on limited exam . Musculoskeletal: not rigid . Psychiatric:unable to assess . Neurologic: no seizure no involuntary movements         Lab Data:   Basic Metabolic Panel: Recent Labs  Lab 09/29/18 0546 10/02/18 0841  NA 143 144  K 3.6 3.2*  CL 109 103  CO2 26 30  GLUCOSE 158* 103*  BUN 19 19  CREATININE 0.65 0.61  CALCIUM 8.1* 8.1*  MG 1.9  --   PHOS 2.8  --     ABG: No results for input(s): PHART, PCO2ART, PO2ART, HCO3, O2SAT in the last 168 hours.  Liver Function Tests: Recent Labs  Lab 09/29/18 0546  AST 19  ALT 20  ALKPHOS 336*  BILITOT 1.4*  PROT 5.3*  ALBUMIN 1.9*   No results for input(s): LIPASE, AMYLASE in the last 168 hours. No results for input(s): AMMONIA in the last 168 hours.  CBC: Recent Labs  Lab 09/29/18 0546  10/01/18 1333 10/01/18 2045 10/02/18 0053 10/02/18 0841 10/02/18 1447  WBC 19.9*   < > 16.8* 15.0* 14.8* 14.0* 14.6*  NEUTROABS 16.4*  --   --   --   --   --   --   HGB 7.1*   < > 7.9* 8.8*  8.6* 8.3* 8.5*  HCT 22.4*   < > 25.0* 26.1* 25.8* 25.6* 26.5*  MCV 98.2   < > 89.6 88.2 88.7 88.9 89.8  PLT 370   < > 362 345 346 351 359   < > = values in this interval not displayed.    Cardiac Enzymes: No results for input(s): CKTOTAL, CKMB, CKMBINDEX, TROPONINI in the last 168 hours.  BNP (last 3 results) Recent Labs    10/01/18 0600  BNP 593.8*    ProBNP (last 3 results) No results for input(s): PROBNP in the last 8760 hours.  Radiological Exams: No results found.  Assessment/Plan Active Problems:   Acute on chronic respiratory failure with hypoxia (HCC)   Influenza A with pneumonia   Hepatic steatosis   Chronic diastolic heart failure (HCC)   Gastric perforation, acute   1. Acute on chronic respiratory failure with hypoxia continue to do well on pressure support and with weaning trials on aerosol trach collar.  Has a goal for him to trach collar today.  Continue pulmonary toilet and secretion management 2. Community-acquired pneumonia continue with current therapy 3. Hepatic steatosis without history of alcohol abuse continue to monitor 4. Chronic diastolic heart failure currently at baseline continue supportive care 5. Gastric  perforation post surgery continue supportive measures   I have personally seen and evaluated the patient, evaluated laboratory and imaging results, formulated the assessment and plan and placed orders. The Patient requires high complexity decision making for assessment and support.  Case was discussed on Rounds with the Respiratory Therapy Staff  Allyne Gee, MD [facility] Pulmonary Critical Care Medicine Sleep Medicine

## 2018-10-03 ENCOUNTER — Other Ambulatory Visit (HOSPITAL_COMMUNITY): Payer: Medicare Other

## 2018-10-03 DIAGNOSIS — K760 Fatty (change of) liver, not elsewhere classified: Secondary | ICD-10-CM | POA: Diagnosis not present

## 2018-10-03 DIAGNOSIS — J9621 Acute and chronic respiratory failure with hypoxia: Secondary | ICD-10-CM | POA: Diagnosis not present

## 2018-10-03 DIAGNOSIS — K3189 Other diseases of stomach and duodenum: Secondary | ICD-10-CM | POA: Diagnosis not present

## 2018-10-03 DIAGNOSIS — I5032 Chronic diastolic (congestive) heart failure: Secondary | ICD-10-CM | POA: Diagnosis not present

## 2018-10-03 LAB — CBC
HCT: 29.3 % — ABNORMAL LOW (ref 36.0–46.0)
Hemoglobin: 9.9 g/dL — ABNORMAL LOW (ref 12.0–15.0)
MCH: 30.7 pg (ref 26.0–34.0)
MCHC: 33.8 g/dL (ref 30.0–36.0)
MCV: 90.7 fL (ref 80.0–100.0)
Platelets: 348 10*3/uL (ref 150–400)
RBC: 3.23 MIL/uL — ABNORMAL LOW (ref 3.87–5.11)
RDW: 17.3 % — ABNORMAL HIGH (ref 11.5–15.5)
WBC: 18.3 10*3/uL — ABNORMAL HIGH (ref 4.0–10.5)
nRBC: 0 % (ref 0.0–0.2)

## 2018-10-03 LAB — HEMOGLOBIN AND HEMATOCRIT, BLOOD
HCT: 20.4 % — ABNORMAL LOW (ref 36.0–46.0)
Hemoglobin: 6.6 g/dL — CL (ref 12.0–15.0)

## 2018-10-03 LAB — POTASSIUM: Potassium: 3.5 mmol/L (ref 3.5–5.1)

## 2018-10-03 LAB — PROTIME-INR
INR: 1.2 (ref 0.8–1.2)
Prothrombin Time: 14.6 seconds (ref 11.4–15.2)

## 2018-10-03 LAB — PREPARE RBC (CROSSMATCH)

## 2018-10-03 MED ORDER — IOHEXOL 300 MG/ML  SOLN
100.0000 mL | Freq: Once | INTRAMUSCULAR | Status: AC | PRN
  Administered 2018-10-03: 100 mL via INTRAVENOUS

## 2018-10-03 NOTE — Progress Notes (Addendum)
Progress Note      ASSESSMENT AND PLAN:   3.  63 yo female with a very complex medical -surgical history as outlined in our consult note.  In March she had repair of gastric perforation, small bowel resection, right hemicolectomy with ileocolonic anastomosis this was followed several days later by splenic flexure takedown, colon resection with Hartman's pouch, ileostomy, jejunostomy revision, and omentectomy.  She still has JP drains in LLQ and RLQ, both with scant output.   2. Intermittent GI bleeding / anemia requiring multiple uPRBC. At times her ileostomy output consists of brownish black fluid , other time more greenish black (melenic). RN emptied ostomy bag in my presence and today the output is more greenish black appearing less like bloody. Liquid consistency of output but not unexpected if she has short gut as described in records. So far ostomy output today ~ 1000 ml.  -She received two additional units of PRBC today after hgb declined again from 8.6 >> 7.4 >> 6.6.  Post H+H not available.  -Continue BID IV PPI.  -At some point she may require EGD and / or ileoscopy -Monitor for volume depletion. Not getting IVF, ostomy output right now is close to a 1000 ml today    2. Cirrhosis, noted by Surgeon. Not sure she has associated portal HTN. INR normal. Platelet count normal.   SUBJECTIVE    Smiles. No abdominal pain    OBJECTIVE:     Vital signs in last 24 hours:   96.8, 89, 20 resp, 143/73 General:   Alert, obese female in NAD EENT:  Normal hearing, non icteric sclera, conjunctive pink.  Heart:  Regular rate and rhythm; no murmur. Pulm: Trach collar on. Normal respiratory effort Abdomen:  Soft, nondistended, long midline surgical incision. JP drains in right and left lower quadrants - both scant output.  Normal bowel sounds, enteral feedings in progress. .       Neurologic:  Alert, shakes head and interacts with facial expressions.     Intake/Output from  previous day: No intake/output data recorded. Intake/Output this shift: No intake/output data recorded.  Lab Results: Recent Labs    10/02/18 0841 10/02/18 1447 10/02/18 2053 10/03/18 0042  WBC 14.0* 14.6* 18.2*  --   HGB 8.3* 8.5* 7.4* 6.6*  HCT 25.6* 26.5* 22.3* 20.4*  PLT 351 359 384  --    BMET Recent Labs    10/02/18 0841 10/03/18 0247  NA 144  --   K 3.2* 3.5  CL 103  --   CO2 30  --   GLUCOSE 103*  --   BUN 19  --   CREATININE 0.61  --   CALCIUM 8.1*  --    LFT No results for input(s): PROT, ALBUMIN, AST, ALT, ALKPHOS, BILITOT, BILIDIR, IBILI in the last 72 hours. PT/INR Recent Labs    10/03/18 0042  LABPROT 14.6  INR 1.2   Hepatitis Panel No results for input(s): HEPBSAG, HCVAB, HEPAIGM, HEPBIGM in the last 72 hours.  No results found.   Active Problems:   Acute on chronic respiratory failure with hypoxia (HCC)   Influenza A with pneumonia   Hepatic steatosis   Chronic diastolic heart failure (HCC)   Gastric perforation, acute   Melena   Heme positive stool   Acute blood loss anemia     LOS: 0 days   Pamela Curry ,NP 10/03/2018, 3:49 PM  GI ATTENDING  Interval history and data reviewed.  Agree with interval progress note  no gross bleeding through ostomy.  Hemoglobin drifts.  Continue observation and supportive care.  As stated previously, for more overt bleeding we would consider upper endoscopy.  For recurrent blood loss without gross bleeding, consider CT imaging to rule out intra-abdominal or retroperitoneal bleeding.     Pamela Chuck. Geri Seminole., M.D. [facility] Division of Gastroenterology

## 2018-10-03 NOTE — Progress Notes (Addendum)
Pulmonary Critical Care Medicine [place]   PULMONARY CRITICAL CARE SERVICE  PROGRESS NOTE  Date of Service: 10/03/2018  Pamela Curry  HLK:[id number]  DOB: [birth year]   DOA: 09/28/2018  Referring Physician: Merton Border, MD  HPI: Pamela Curry is a 64 y.o. female seen for follow up of Acute on Chronic Respiratory Failure.  Patient has an 8-hour goal today on aerosol trach collar 28% FiO2.  Currently doing well with this.  Remains afebrile today.  Medications: Reviewed on Rounds  Physical Exam:  Vitals: Pulse 96 respirations 21 BP 139/65 O2 sat 100% 97.3  Ventilator Settings ATC 28%  . General: Comfortable at this time . Eyes: Grossly normal lids, irises & conjunctiva . ENT: grossly tongue is normal . Neck: no obvious mass . Cardiovascular: S1 S2 normal no gallop . Respiratory: No rales rhonchi noted . Abdomen: soft . Skin: no rash seen on limited exam . Musculoskeletal: not rigid . Psychiatric:unable to assess . Neurologic: no seizure no involuntary movements         Lab Data:   Basic Metabolic Panel: Recent Labs  Lab 09/29/18 0546 10/02/18 0841 10/03/18 0247  NA 143 144  --   K 3.6 3.2* 3.5  CL 109 103  --   CO2 26 30  --   GLUCOSE 158* 103*  --   BUN 19 19  --   CREATININE 0.65 0.61  --   CALCIUM 8.1* 8.1*  --   MG 1.9  --   --   PHOS 2.8  --   --     ABG: No results for input(s): PHART, PCO2ART, PO2ART, HCO3, O2SAT in the last 168 hours.  Liver Function Tests: Recent Labs  Lab 09/29/18 0546  AST 19  ALT 20  ALKPHOS 336*  BILITOT 1.4*  PROT 5.3*  ALBUMIN 1.9*   No results for input(s): LIPASE, AMYLASE in the last 168 hours. No results for input(s): AMMONIA in the last 168 hours.  CBC: Recent Labs  Lab 09/29/18 0546  10/01/18 2045 10/02/18 0053 10/02/18 0841 10/02/18 1447 10/02/18 2053 10/03/18 0042  WBC 19.9*   < > 15.0* 14.8* 14.0* 14.6* 18.2*  --   NEUTROABS 16.4*  --   --   --   --   --   --   --   HGB 7.1*    < > 8.8* 8.6* 8.3* 8.5* 7.4* 6.6*  HCT 22.4*   < > 26.1* 25.8* 25.6* 26.5* 22.3* 20.4*  MCV 98.2   < > 88.2 88.7 88.9 89.8 91.0  --   PLT 370   < > 345 346 351 359 384  --    < > = values in this interval not displayed.    Cardiac Enzymes: No results for input(s): CKTOTAL, CKMB, CKMBINDEX, TROPONINI in the last 168 hours.  BNP (last 3 results) Recent Labs    10/01/18 0600  BNP 593.8*    ProBNP (last 3 results) No results for input(s): PROBNP in the last 8760 hours.  Radiological Exams: No results found.  Assessment/Plan Active Problems:   Acute on chronic respiratory failure with hypoxia (HCC)   Influenza A with pneumonia   Hepatic steatosis   Chronic diastolic heart failure (HCC)   Gastric perforation, acute   Melena   Heme positive stool   Acute blood loss anemia   1. Acute on chronic respiratory failure with hypoxia patient is currently doing well on aerosol trach collar 28% with an 8-hour goal today.  Continue weaning  per protocol.  Continue pulmonary toilet and secretion management 2. Community-acquired pneumonia continue with current therapy 3. Hepatic steatosis without history of alcohol abuse continue to monitor 4. Chronic diastolic heart failure currently at baseline continue supportive care 5. Gastric perforation post surgery, continue supportive measures   I have personally seen and evaluated the patient, evaluated laboratory and imaging results, formulated the assessment and plan and placed orders. The Patient requires high complexity decision making for assessment and support.  Case was discussed on Rounds with the Respiratory Therapy Staff  Allyne Gee, MD [facility] Pulmonary Critical Care Medicine Sleep Medicine

## 2018-10-04 DIAGNOSIS — I5032 Chronic diastolic (congestive) heart failure: Secondary | ICD-10-CM | POA: Diagnosis not present

## 2018-10-04 DIAGNOSIS — K3189 Other diseases of stomach and duodenum: Secondary | ICD-10-CM | POA: Diagnosis not present

## 2018-10-04 DIAGNOSIS — J9621 Acute and chronic respiratory failure with hypoxia: Secondary | ICD-10-CM | POA: Diagnosis not present

## 2018-10-04 DIAGNOSIS — K760 Fatty (change of) liver, not elsewhere classified: Secondary | ICD-10-CM | POA: Diagnosis not present

## 2018-10-04 LAB — CBC
HCT: 23.1 % — ABNORMAL LOW (ref 36.0–46.0)
HCT: 22.3 % — ABNORMAL LOW (ref 36.0–46.0)
Hemoglobin: 7.7 g/dL — ABNORMAL LOW (ref 12.0–15.0)
Hemoglobin: 7.2 g/dL — ABNORMAL LOW (ref 12.0–15.0)
MCH: 30.2 pg (ref 26.0–34.0)
MCH: 29.8 pg (ref 26.0–34.0)
MCHC: 33.3 g/dL (ref 30.0–36.0)
MCHC: 32.3 g/dL (ref 30.0–36.0)
MCV: 90.6 fL (ref 80.0–100.0)
MCV: 92.1 fL (ref 80.0–100.0)
Platelets: 350 10*3/uL (ref 150–400)
Platelets: 371 10*3/uL (ref 150–400)
RBC: 2.55 MIL/uL — ABNORMAL LOW (ref 3.87–5.11)
RBC: 2.42 MIL/uL — ABNORMAL LOW (ref 3.87–5.11)
RDW: 17.8 % — ABNORMAL HIGH (ref 11.5–15.5)
RDW: 18 % — ABNORMAL HIGH (ref 11.5–15.5)
WBC: 18.6 10*3/uL — ABNORMAL HIGH (ref 4.0–10.5)
WBC: 17.6 10*3/uL — ABNORMAL HIGH (ref 4.0–10.5)
nRBC: 0 % (ref 0.0–0.2)
nRBC: 0 % (ref 0.0–0.2)

## 2018-10-04 NOTE — H&P (View-Only) (Signed)
Progress Note    ASSESSMENT AND PLAN:   64.  63 yo female with a very complex medical -surgical history as outlined in our consult note.  She is s/p recent, extensive bowel surgeries. Having dark liquid in ileostomy which per RN at times is clearly melenic looking. Yesterday and today output more bilious.   Even so, she continues to require PRBC and has received 5 units in last 3-4 days. Two of those units were given yesterday afternoon for hgb of 6.6. Post H+H 9.9 which, while an overzealous response, I wouldn't expect her to drift to 7.7 today.   -CT scan shows multiloculated fluid collections in upper abdomen which may be old hematoma / serology. No suggestion of recent intraabdominal bleeding.  -Will arrange for EGD with general anesthesia to be done tomorrow. The risks and benefits of EGD were discussed and the patient agrees to proceed.  -Order written to hold enteral feedings at midnight.  -continue IV PPI -monitor H+H and continue to transfuse prn  2. Cirrhosis, noted by Surgeon. No signs of portal HTN. CT scan w/ contrast yesterday didn't describe cirrhotic appearing liver. sure she has associated portal HTN. INR normal. Platelet count normal.  3. Acute on chronic respiratory failure, on trach collar / 02.   4. Leukocytosis, etiology? Maybe from Influenza PNA?    SUBJECTIVE    No abdominal pain.    OBJECTIVE:     Vital signs in last 24 hours:   T 96.3 - HR 91 - Rep 28 - BP 152/83 General:   Alert, female in NAD EENT:  Normal hearing, non icteric sclera, conjunctive pink.  Heart:  Regular rate and rhythm; no murmur.  Pulm: Trach collar getting 02. PCCM trying to wean.  Abdomen:  Soft, nondistended.  Normal bowel sounds. Both JP drains intact with scant drainage. Ileostomy >>bile colored watery liquid       Psych:  Pleasant, cooperative.  Smiles and trying to speak. Engages with eyes / facial expressions.    Intake/Output from previous day: No intake/output data  recorded. Intake/Output this shift: No intake/output data recorded.  Lab Results: Recent Labs    10/02/18 2053 10/03/18 0042 10/03/18 1627 10/04/18 0554  WBC 18.2*  --  18.3* 18.6*  HGB 7.4* 6.6* 9.9* 7.7*  HCT 22.3* 20.4* 29.3* 23.1*  PLT 384  --  348 350   BMET Recent Labs    10/02/18 0841 10/03/18 0247  NA 144  --   K 3.2* 3.5  CL 103  --   CO2 30  --   GLUCOSE 103*  --   BUN 19  --   CREATININE 0.61  --   CALCIUM 8.1*  --    LFT No results for input(s): PROT, ALBUMIN, AST, ALT, ALKPHOS, BILITOT, BILIDIR, IBILI in the last 72 hours. PT/INR Recent Labs    10/03/18 0042  LABPROT 14.6  INR 1.2   Hepatitis Panel No results for input(s): HEPBSAG, HCVAB, HEPAIGM, HEPBIGM in the last 72 hours.  Ct Abdomen Pelvis W Contrast  Result Date: 10/03/2018 CLINICAL DATA:  Complex medical-surgical history. Repair of gastric perforation in March with small-bowel resection, RIGHT hemicolectomy with ileocolonic anastomosis followed several days later by a splenic flexure takedown, colon resection with Hartmann's pouch, ileostomy, jejunostomy revision and omentectomy. Now having GI bleeding/anemia. EXAM: CT ABDOMEN AND PELVIS WITH CONTRAST TECHNIQUE: Multidetector CT imaging of the abdomen and pelvis was performed using the standard protocol following bolus administration of intravenous contrast. CONTRAST:  121mL  OMNIPAQUE IOHEXOL 300 MG/ML  SOLN COMPARISON:  None. FINDINGS: Lower chest: Small ill-defined consolidation at the medial aspects of the RIGHT lung base, atelectasis versus pneumonia. Hepatobiliary: Status post cholecystectomy. No focal liver abnormality identified. Fluid collection within the gallbladder fossa and along the undersurface of the LEFT hepatic lobe, measuring approximately 5 x 2 cm and 6 x 3 cm respectively (axial series 3, images 22 and 29). Pancreas: Unremarkable. No pancreatic ductal dilatation or surrounding inflammatory changes. Spleen: Normal in size without  focal abnormality. Adrenals/Urinary Tract: Adrenal glands appear normal. Kidneys are unremarkable without mass, stone or hydronephrosis. Bladder is decompressed by Foley catheter. Stomach/Bowel: Surgical changes of partial gastric resection with gastrojejunostomy. Additional surgical changes of partial colectomy at the level of the transverse colon. There is a RIGHT lower quadrant ostomy. Percutaneous T shaped jejunostomy tube appears appropriately positioned within the LEFT lower abdomen, with midline anterior abdominal wall entrance. No dilated bowel loops. Bowel contrast to the level of the rectum is presumably residual from a previous exam. Vascular/Lymphatic: Aortic atherosclerosis. No acute appearing vascular abnormality. No active hemorrhage/contrast extravasation identified. No enlarged lymph nodes seen in the abdomen or pelvis. Reproductive: Presumed hysterectomy. No adnexal mass. Other: Multiloculated fluid collection within the LEFT upper quadrant, underlying the spleen, measuring approximately 6 cm transverse dimension and approximately 10 cm craniocaudal dimension (coronal series 6, image 46; axial series 3, image 30). These collections appear near simple fluid density based on CT density measurements. No hyperdense fluid within the abdomen or pelvis to suggest acute or recent hemorrhage. No free intraperitoneal air. Musculoskeletal: No acute or suspicious osseous finding. Degenerative changes throughout the thoracolumbar spine, mild to moderate in degree. Ill-defined edema within the superficial subcutaneous soft tissues suggesting some degree of anasarca. Two drainage catheters are present that terminate within the subcutaneous soft tissues of the anterior abdominal wall and RIGHT lateral abdominal wall. IMPRESSION: 1. Multiloculated fluid collections within the left upper quadrant, underlying the spleen, overall measuring approximately 10 x 6 cm, with simple appearing fluid density, most suggestive  of old hematoma or seroma. No air within these collections or other confirming signs of abscess. Similar appearing fluid collections in the gallbladder fossa and underlying the LEFT liver lobe, with measurements given above. Additional small amount of free fluid in the lower abdomen and pelvis. 2. No active hemorrhage/contrast extravasation appreciated. No evidence of acute or recent hemorrhage within the abdomen or pelvis. 3. Right lower quadrant ostomy, with additional surgical changes of partial colectomy and gastrojejunostomy. No evidence of anastomotic leak. No bowel obstruction. Oral contrast to the level of the rectum is presumably residual from previous exam. 4. Percutaneous T shaped jejunostomy tube appears appropriately positioned, with midline anterior abdominal wall entrance. 5. Small ill-defined consolidation at the medial aspects of the right lung base, atelectasis versus pneumonia. 6. Probable anasarca. 7. Additional chronic/incidental findings detailed above. Aortic Atherosclerosis (ICD10-I70.0). Electronically Signed   By: Franki Cabot M.D.   On: 10/03/2018 19:23      Active Problems:   Acute on chronic respiratory failure with hypoxia (HCC)   Influenza A with pneumonia   Hepatic steatosis   Chronic diastolic heart failure (HCC)   Gastric perforation, acute   Melena   Heme positive stool   Acute blood loss anemia     LOS: 0 days   Tye Savoy ,NP 10/04/2018, 4:05 PM

## 2018-10-04 NOTE — Progress Notes (Addendum)
Pulmonary Critical Care Medicine [place]   PULMONARY CRITICAL CARE SERVICE  PROGRESS NOTE  Date of Service: 10/04/2018  Pamela Curry  PXT:[id number]  DOB: Jul 08, 1955   DOA: 09/28/2018  Referring Physician: Merton Border, MD  HPI: Pamela Curry is a 63 y.o. female seen for follow up of Acute on Chronic Respiratory Failure.  Patient has a 12-hour goal today on aerosol trach collar 28% FiO2.  Currently doing well with no distress at this time.  Medications: Reviewed on Rounds  Physical Exam:  Vitals: Pulse 91 respirations 28 BP 152/83 O2 sat 100% temp 96.3  Ventilator Settings ATC 28%  . General: Comfortable at this time . Eyes: Grossly normal lids, irises & conjunctiva . ENT: grossly tongue is normal . Neck: no obvious mass . Cardiovascular: S1 S2 normal no gallop . Respiratory: No rales or rhonchi noted . Abdomen: soft . Skin: no rash seen on limited exam . Musculoskeletal: not rigid . Psychiatric:unable to assess . Neurologic: no seizure no involuntary movements         Lab Data:   Basic Metabolic Panel: Recent Labs  Lab 09/29/18 0546 10/02/18 0841 10/03/18 0247  NA 143 144  --   K 3.6 3.2* 3.5  CL 109 103  --   CO2 26 30  --   GLUCOSE 158* 103*  --   BUN 19 19  --   CREATININE 0.65 0.61  --   CALCIUM 8.1* 8.1*  --   MG 1.9  --   --   PHOS 2.8  --   --     ABG: No results for input(s): PHART, PCO2ART, PO2ART, HCO3, O2SAT in the last 168 hours.  Liver Function Tests: Recent Labs  Lab 09/29/18 0546  AST 19  ALT 20  ALKPHOS 336*  BILITOT 1.4*  PROT 5.3*  ALBUMIN 1.9*   No results for input(s): LIPASE, AMYLASE in the last 168 hours. No results for input(s): AMMONIA in the last 168 hours.  CBC: Recent Labs  Lab 09/29/18 0546  10/02/18 0841 10/02/18 1447 10/02/18 2053 10/03/18 0042 10/03/18 1627 10/04/18 0554  WBC 19.9*   < > 14.0* 14.6* 18.2*  --  18.3* 18.6*  NEUTROABS 16.4*  --   --   --   --   --   --   --   HGB  7.1*   < > 8.3* 8.5* 7.4* 6.6* 9.9* 7.7*  HCT 22.4*   < > 25.6* 26.5* 22.3* 20.4* 29.3* 23.1*  MCV 98.2   < > 88.9 89.8 91.0  --  90.7 90.6  PLT 370   < > 351 359 384  --  348 350   < > = values in this interval not displayed.    Cardiac Enzymes: No results for input(s): CKTOTAL, CKMB, CKMBINDEX, TROPONINI in the last 168 hours.  BNP (last 3 results) Recent Labs    10/01/18 0600  BNP 593.8*    ProBNP (last 3 results) No results for input(s): PROBNP in the last 8760 hours.  Radiological Exams: Ct Abdomen Pelvis W Contrast  Result Date: 10/03/2018 CLINICAL DATA:  Complex medical-surgical history. Repair of gastric perforation in March with small-bowel resection, RIGHT hemicolectomy with ileocolonic anastomosis followed several days later by a splenic flexure takedown, colon resection with Hartmann's pouch, ileostomy, jejunostomy revision and omentectomy. Now having GI bleeding/anemia. EXAM: CT ABDOMEN AND PELVIS WITH CONTRAST TECHNIQUE: Multidetector CT imaging of the abdomen and pelvis was performed using the standard protocol following bolus administration of intravenous  contrast. CONTRAST:  131mL OMNIPAQUE IOHEXOL 300 MG/ML  SOLN COMPARISON:  None. FINDINGS: Lower chest: Small ill-defined consolidation at the medial aspects of the RIGHT lung base, atelectasis versus pneumonia. Hepatobiliary: Status post cholecystectomy. No focal liver abnormality identified. Fluid collection within the gallbladder fossa and along the undersurface of the LEFT hepatic lobe, measuring approximately 5 x 2 cm and 6 x 3 cm respectively (axial series 3, images 22 and 29). Pancreas: Unremarkable. No pancreatic ductal dilatation or surrounding inflammatory changes. Spleen: Normal in size without focal abnormality. Adrenals/Urinary Tract: Adrenal glands appear normal. Kidneys are unremarkable without mass, stone or hydronephrosis. Bladder is decompressed by Foley catheter. Stomach/Bowel: Surgical changes of partial  gastric resection with gastrojejunostomy. Additional surgical changes of partial colectomy at the level of the transverse colon. There is a RIGHT lower quadrant ostomy. Percutaneous T shaped jejunostomy tube appears appropriately positioned within the LEFT lower abdomen, with midline anterior abdominal wall entrance. No dilated bowel loops. Bowel contrast to the level of the rectum is presumably residual from a previous exam. Vascular/Lymphatic: Aortic atherosclerosis. No acute appearing vascular abnormality. No active hemorrhage/contrast extravasation identified. No enlarged lymph nodes seen in the abdomen or pelvis. Reproductive: Presumed hysterectomy. No adnexal mass. Other: Multiloculated fluid collection within the LEFT upper quadrant, underlying the spleen, measuring approximately 6 cm transverse dimension and approximately 10 cm craniocaudal dimension (coronal series 6, image 46; axial series 3, image 30). These collections appear near simple fluid density based on CT density measurements. No hyperdense fluid within the abdomen or pelvis to suggest acute or recent hemorrhage. No free intraperitoneal air. Musculoskeletal: No acute or suspicious osseous finding. Degenerative changes throughout the thoracolumbar spine, mild to moderate in degree. Ill-defined edema within the superficial subcutaneous soft tissues suggesting some degree of anasarca. Two drainage catheters are present that terminate within the subcutaneous soft tissues of the anterior abdominal wall and RIGHT lateral abdominal wall. IMPRESSION: 1. Multiloculated fluid collections within the left upper quadrant, underlying the spleen, overall measuring approximately 10 x 6 cm, with simple appearing fluid density, most suggestive of old hematoma or seroma. No air within these collections or other confirming signs of abscess. Similar appearing fluid collections in the gallbladder fossa and underlying the LEFT liver lobe, with measurements given above.  Additional small amount of free fluid in the lower abdomen and pelvis. 2. No active hemorrhage/contrast extravasation appreciated. No evidence of acute or recent hemorrhage within the abdomen or pelvis. 3. Right lower quadrant ostomy, with additional surgical changes of partial colectomy and gastrojejunostomy. No evidence of anastomotic leak. No bowel obstruction. Oral contrast to the level of the rectum is presumably residual from previous exam. 4. Percutaneous T shaped jejunostomy tube appears appropriately positioned, with midline anterior abdominal wall entrance. 5. Small ill-defined consolidation at the medial aspects of the right lung base, atelectasis versus pneumonia. 6. Probable anasarca. 7. Additional chronic/incidental findings detailed above. Aortic Atherosclerosis (ICD10-I70.0). Electronically Signed   By: Franki Cabot M.D.   On: 10/03/2018 19:23    Assessment/Plan Active Problems:   Acute on chronic respiratory failure with hypoxia (HCC)   Influenza A with pneumonia   Hepatic steatosis   Chronic diastolic heart failure (HCC)   Gastric perforation, acute   Melena   Heme positive stool   Acute blood loss anemia   1. Acute on chronic respiratory failure with hypoxia doing well on ATC 28% with a 12-hour goal today.  Continue weaning per protocol.  Continue pulmonary toilet and secretion management 2. Community-acquired pneumonia continue present therapy  3. Hepatic steatosis without history of alcohol abuse continue to monitor 4. Chronic diastolic heart failure currently at baseline continue supportive care 5. Gastric perforation post surgery continue supportive measures   I have personally seen and evaluated the patient, evaluated laboratory and imaging results, formulated the assessment and plan and placed orders. The Patient requires high complexity decision making for assessment and support.  Case was discussed on Rounds with the Respiratory Therapy Staff  Allyne Gee, MD  [facility] Pulmonary Critical Care Medicine Sleep Medicine

## 2018-10-04 NOTE — Progress Notes (Signed)
Progress Note    ASSESSMENT AND PLAN:   83.  63 yo female with a very complex medical -surgical history as outlined in our consult note.  She is s/p recent, extensive bowel surgeries. Having dark liquid in ileostomy which per RN at times is clearly melenic looking. Yesterday and today output more bilious.   Even so, she continues to require PRBC and has received 5 units in last 3-4 days. Two of those units were given yesterday afternoon for hgb of 6.6. Post H+H 9.9 which, while an overzealous response, I wouldn't expect her to drift to 7.7 today.   -CT scan shows multiloculated fluid collections in upper abdomen which may be old hematoma / serology. No suggestion of recent intraabdominal bleeding.  -Will arrange for EGD with general anesthesia to be done tomorrow. The risks and benefits of EGD were discussed and the patient agrees to proceed.  -Order written to hold enteral feedings at midnight.  -continue IV PPI -monitor H+H and continue to transfuse prn  2. Cirrhosis, noted by Surgeon. No signs of portal HTN. CT scan w/ contrast yesterday didn't describe cirrhotic appearing liver. sure she has associated portal HTN. INR normal. Platelet count normal.  3. Acute on chronic respiratory failure, on trach collar / 02.   4. Leukocytosis, etiology? Maybe from Influenza PNA?    SUBJECTIVE    No abdominal pain.    OBJECTIVE:     Vital signs in last 24 hours:   T 96.3 - HR 91 - Rep 28 - BP 152/83 General:   Alert, female in NAD EENT:  Normal hearing, non icteric sclera, conjunctive pink.  Heart:  Regular rate and rhythm; no murmur.  Pulm: Trach collar getting 02. PCCM trying to wean.  Abdomen:  Soft, nondistended.  Normal bowel sounds. Both JP drains intact with scant drainage. Ileostomy >>bile colored watery liquid       Psych:  Pleasant, cooperative.  Smiles and trying to speak. Engages with eyes / facial expressions.    Intake/Output from previous day: No intake/output data  recorded. Intake/Output this shift: No intake/output data recorded.  Lab Results: Recent Labs    10/02/18 2053 10/03/18 0042 10/03/18 1627 10/04/18 0554  WBC 18.2*  --  18.3* 18.6*  HGB 7.4* 6.6* 9.9* 7.7*  HCT 22.3* 20.4* 29.3* 23.1*  PLT 384  --  348 350   BMET Recent Labs    10/02/18 0841 10/03/18 0247  NA 144  --   K 3.2* 3.5  CL 103  --   CO2 30  --   GLUCOSE 103*  --   BUN 19  --   CREATININE 0.61  --   CALCIUM 8.1*  --    LFT No results for input(s): PROT, ALBUMIN, AST, ALT, ALKPHOS, BILITOT, BILIDIR, IBILI in the last 72 hours. PT/INR Recent Labs    10/03/18 0042  LABPROT 14.6  INR 1.2   Hepatitis Panel No results for input(s): HEPBSAG, HCVAB, HEPAIGM, HEPBIGM in the last 72 hours.  Ct Abdomen Pelvis W Contrast  Result Date: 10/03/2018 CLINICAL DATA:  Complex medical-surgical history. Repair of gastric perforation in March with small-bowel resection, RIGHT hemicolectomy with ileocolonic anastomosis followed several days later by a splenic flexure takedown, colon resection with Hartmann's pouch, ileostomy, jejunostomy revision and omentectomy. Now having GI bleeding/anemia. EXAM: CT ABDOMEN AND PELVIS WITH CONTRAST TECHNIQUE: Multidetector CT imaging of the abdomen and pelvis was performed using the standard protocol following bolus administration of intravenous contrast. CONTRAST:  161mL  OMNIPAQUE IOHEXOL 300 MG/ML  SOLN COMPARISON:  None. FINDINGS: Lower chest: Small ill-defined consolidation at the medial aspects of the RIGHT lung base, atelectasis versus pneumonia. Hepatobiliary: Status post cholecystectomy. No focal liver abnormality identified. Fluid collection within the gallbladder fossa and along the undersurface of the LEFT hepatic lobe, measuring approximately 5 x 2 cm and 6 x 3 cm respectively (axial series 3, images 22 and 29). Pancreas: Unremarkable. No pancreatic ductal dilatation or surrounding inflammatory changes. Spleen: Normal in size without  focal abnormality. Adrenals/Urinary Tract: Adrenal glands appear normal. Kidneys are unremarkable without mass, stone or hydronephrosis. Bladder is decompressed by Foley catheter. Stomach/Bowel: Surgical changes of partial gastric resection with gastrojejunostomy. Additional surgical changes of partial colectomy at the level of the transverse colon. There is a RIGHT lower quadrant ostomy. Percutaneous T shaped jejunostomy tube appears appropriately positioned within the LEFT lower abdomen, with midline anterior abdominal wall entrance. No dilated bowel loops. Bowel contrast to the level of the rectum is presumably residual from a previous exam. Vascular/Lymphatic: Aortic atherosclerosis. No acute appearing vascular abnormality. No active hemorrhage/contrast extravasation identified. No enlarged lymph nodes seen in the abdomen or pelvis. Reproductive: Presumed hysterectomy. No adnexal mass. Other: Multiloculated fluid collection within the LEFT upper quadrant, underlying the spleen, measuring approximately 6 cm transverse dimension and approximately 10 cm craniocaudal dimension (coronal series 6, image 46; axial series 3, image 30). These collections appear near simple fluid density based on CT density measurements. No hyperdense fluid within the abdomen or pelvis to suggest acute or recent hemorrhage. No free intraperitoneal air. Musculoskeletal: No acute or suspicious osseous finding. Degenerative changes throughout the thoracolumbar spine, mild to moderate in degree. Ill-defined edema within the superficial subcutaneous soft tissues suggesting some degree of anasarca. Two drainage catheters are present that terminate within the subcutaneous soft tissues of the anterior abdominal wall and RIGHT lateral abdominal wall. IMPRESSION: 1. Multiloculated fluid collections within the left upper quadrant, underlying the spleen, overall measuring approximately 10 x 6 cm, with simple appearing fluid density, most suggestive  of old hematoma or seroma. No air within these collections or other confirming signs of abscess. Similar appearing fluid collections in the gallbladder fossa and underlying the LEFT liver lobe, with measurements given above. Additional small amount of free fluid in the lower abdomen and pelvis. 2. No active hemorrhage/contrast extravasation appreciated. No evidence of acute or recent hemorrhage within the abdomen or pelvis. 3. Right lower quadrant ostomy, with additional surgical changes of partial colectomy and gastrojejunostomy. No evidence of anastomotic leak. No bowel obstruction. Oral contrast to the level of the rectum is presumably residual from previous exam. 4. Percutaneous T shaped jejunostomy tube appears appropriately positioned, with midline anterior abdominal wall entrance. 5. Small ill-defined consolidation at the medial aspects of the right lung base, atelectasis versus pneumonia. 6. Probable anasarca. 7. Additional chronic/incidental findings detailed above. Aortic Atherosclerosis (ICD10-I70.0). Electronically Signed   By: Franki Cabot M.D.   On: 10/03/2018 19:23      Active Problems:   Acute on chronic respiratory failure with hypoxia (HCC)   Influenza A with pneumonia   Hepatic steatosis   Chronic diastolic heart failure (HCC)   Gastric perforation, acute   Melena   Heme positive stool   Acute blood loss anemia     LOS: 0 days   Tye Savoy ,NP 10/04/2018, 4:05 PM

## 2018-10-05 ENCOUNTER — Encounter (HOSPITAL_COMMUNITY): Payer: Medicare Other | Admitting: Anesthesiology

## 2018-10-05 ENCOUNTER — Encounter
Disposition: A | Discharge status: Hospice | Payer: Self-pay | Source: Other Acute Inpatient Hospital | Attending: Internal Medicine

## 2018-10-05 ENCOUNTER — Encounter (HOSPITAL_COMMUNITY): Payer: Self-pay | Admitting: Emergency Medicine

## 2018-10-05 ENCOUNTER — Other Ambulatory Visit: Payer: Self-pay

## 2018-10-05 DIAGNOSIS — I5032 Chronic diastolic (congestive) heart failure: Secondary | ICD-10-CM | POA: Diagnosis not present

## 2018-10-05 DIAGNOSIS — K760 Fatty (change of) liver, not elsewhere classified: Secondary | ICD-10-CM | POA: Diagnosis not present

## 2018-10-05 DIAGNOSIS — Z980 Intestinal bypass and anastomosis status: Secondary | ICD-10-CM

## 2018-10-05 DIAGNOSIS — J9621 Acute and chronic respiratory failure with hypoxia: Secondary | ICD-10-CM | POA: Diagnosis not present

## 2018-10-05 DIAGNOSIS — K3189 Other diseases of stomach and duodenum: Secondary | ICD-10-CM | POA: Diagnosis not present

## 2018-10-05 HISTORY — PX: ESOPHAGOGASTRODUODENOSCOPY (EGD) WITH PROPOFOL: SHX5813

## 2018-10-05 LAB — CBC
HCT: 19.1 % — ABNORMAL LOW (ref 36.0–46.0)
HCT: 24.7 % — ABNORMAL LOW (ref 36.0–46.0)
Hemoglobin: 6 g/dL — CL (ref 12.0–15.0)
Hemoglobin: 8.3 g/dL — ABNORMAL LOW (ref 12.0–15.0)
MCH: 29.3 pg (ref 26.0–34.0)
MCH: 30.5 pg (ref 26.0–34.0)
MCHC: 31.4 g/dL (ref 30.0–36.0)
MCHC: 33.6 g/dL (ref 30.0–36.0)
MCV: 93.2 fL (ref 80.0–100.0)
MCV: 90.8 fL (ref 80.0–100.0)
Platelets: 368 10*3/uL (ref 150–400)
Platelets: 328 10*3/uL (ref 150–400)
RBC: 2.05 MIL/uL — ABNORMAL LOW (ref 3.87–5.11)
RBC: 2.72 MIL/uL — ABNORMAL LOW (ref 3.87–5.11)
RDW: 18.3 % — ABNORMAL HIGH (ref 11.5–15.5)
RDW: 18.3 % — ABNORMAL HIGH (ref 11.5–15.5)
WBC: 17.9 10*3/uL — ABNORMAL HIGH (ref 4.0–10.5)
WBC: 17.1 10*3/uL — ABNORMAL HIGH (ref 4.0–10.5)
nRBC: 0 % (ref 0.0–0.2)
nRBC: 0 % (ref 0.0–0.2)

## 2018-10-05 LAB — PREPARE RBC (CROSSMATCH)

## 2018-10-05 SURGERY — ESOPHAGOGASTRODUODENOSCOPY (EGD) WITH PROPOFOL
Anesthesia: Monitor Anesthesia Care

## 2018-10-05 MED ORDER — LIDOCAINE 2% (20 MG/ML) 5 ML SYRINGE
INTRAMUSCULAR | Status: DC | PRN
  Administered 2018-10-05: 40 mg via INTRAVENOUS

## 2018-10-05 MED ORDER — PROPOFOL 10 MG/ML IV BOLUS
INTRAVENOUS | Status: DC | PRN
  Administered 2018-10-05: 20 mg via INTRAVENOUS

## 2018-10-05 MED ORDER — PROPOFOL 500 MG/50ML IV EMUL
INTRAVENOUS | Status: DC | PRN
  Administered 2018-10-05: 40 ug/kg/min via INTRAVENOUS

## 2018-10-05 MED ORDER — SODIUM CHLORIDE 0.9 % IV SOLN: INTRAVENOUS | Status: DC

## 2018-10-05 MED ORDER — LACTATED RINGERS IV SOLN
INTRAVENOUS | Status: DC | PRN
  Administered 2018-10-05: 11:00:00 via INTRAVENOUS

## 2018-10-05 SURGICAL SUPPLY — 14 items

## 2018-10-05 NOTE — Transfer of Care (Signed)
Immediate Anesthesia Transfer of Care Note  Patient: Pamela Curry  Procedure(s) Performed: ESOPHAGOGASTRODUODENOSCOPY (EGD) WITH PROPOFOL (N/A )  Patient Location: PACU and Endoscopy Unit  Anesthesia Type:MAC  Level of Consciousness: awake, alert  and patient cooperative  Airway & Oxygen Therapy: Patient Spontanous Breathing and Patient connected to tracheostomy mask oxygen  Post-op Assessment: Report given to RN and Post -op Vital signs reviewed and stable  Post vital signs: Reviewed and stable  Last Vitals:  Vitals Value Taken Time  BP 114/45 10/05/2018 12:07 PM  Temp 37.3 C 10/05/2018 12:07 PM  Pulse 105 10/05/2018 12:11 PM  Resp 26 10/05/2018 12:11 PM  SpO2 97 % 10/05/2018 12:11 PM  Vitals shown include unvalidated device data.  Last Pain:  Vitals:   10/05/18 1207  TempSrc: Axillary         Complications: No apparent anesthesia complications

## 2018-10-05 NOTE — Anesthesia Postprocedure Evaluation (Signed)
Anesthesia Post Note  Patient: Pamela Curry  Procedure(s) Performed: ESOPHAGOGASTRODUODENOSCOPY (EGD) WITH PROPOFOL (N/A )     Patient location during evaluation: PACU Anesthesia Type: MAC Level of consciousness: awake and alert Pain management: pain level controlled Vital Signs Assessment: post-procedure vital signs reviewed and stable Respiratory status: spontaneous breathing, nonlabored ventilation, respiratory function stable and patient connected to nasal cannula oxygen Cardiovascular status: stable and blood pressure returned to baseline Postop Assessment: no apparent nausea or vomiting Anesthetic complications: no    Last Vitals:  Vitals:   10/05/18 1207 10/05/18 1210  BP: (!) 114/45 (!) 114/45  Pulse: (!) 105 (!) 105  Resp: (!) 26 (!) 26  Temp: 37.3 C   SpO2: 93% 97%    Last Pain:  Vitals:   10/05/18 1207  TempSrc: Axillary                 [name] S

## 2018-10-05 NOTE — Anesthesia Procedure Notes (Signed)
Procedure Name: MAC Date/Time: 10/05/2018 11:52 AM Performed by: Neldon Newport, CRNA Pre-anesthesia Checklist: Patient identified, Emergency Drugs available, Suction available, Patient being monitored and Timeout performed Patient Re-evaluated:Patient Re-evaluated prior to induction Oxygen Delivery Method: Simple face mask

## 2018-10-05 NOTE — Anesthesia Preprocedure Evaluation (Signed)
Anesthesia Evaluation  Patient identified by MRN, date of birth, ID band Patient awake    Reviewed: Allergy & Precautions, NPO status , Patient's Chart, lab work & pertinent test results  Airway Mallampati: Trach  TM Distance: >3 FB Neck ROM: Full    Dental no notable dental hx.    Pulmonary pneumonia, COPD,  oxygen dependent,    breath sounds clear to auscultation+ rhonchi        Cardiovascular negative cardio ROS Normal cardiovascular exam Rhythm:Regular Rate:Normal     Neuro/Psych negative neurological ROS  negative psych ROS   GI/Hepatic negative GI ROS, Neg liver ROS,   Endo/Other  negative endocrine ROS  Renal/GU ARFRenal disease  negative genitourinary   Musculoskeletal negative musculoskeletal ROS (+)   Abdominal   Peds negative pediatric ROS (+)  Hematology negative hematology ROS (+) anemia ,   Anesthesia Other Findings   Reproductive/Obstetrics negative OB ROS                             Anesthesia Physical Anesthesia Plan  ASA: IV  Anesthesia Plan: MAC   Post-op Pain Management:    Induction: Intravenous  PONV Risk Score and Plan: 0  Airway Management Planned: Simple Face Mask  Additional Equipment:   Intra-op Plan:   Post-operative Plan:   Informed Consent: I have reviewed the patients History and Physical, chart, labs and discussed the procedure including the risks, benefits and alternatives for the proposed anesthesia with the patient or authorized representative who has indicated his/her understanding and acceptance.     Dental advisory given  Plan Discussed with: CRNA and Surgeon  Anesthesia Plan Comments:         Anesthesia Quick Evaluation

## 2018-10-05 NOTE — Progress Notes (Addendum)
Pulmonary Critical Care Medicine [place]   PULMONARY CRITICAL CARE SERVICE  PROGRESS NOTE  Date of Service: 10/05/2018  Cledith Kamiya  UXN:[id number]  DOB: Sep 21, 1955   DOA: 09/28/2018  Referring Physician: Merton Border, MD  HPI: Nicholle Falzon is a 63 y.o. female seen for follow up of Acute on Chronic Respiratory Failure.  Patient has a 16-hour aerosol treatment goal today on 28% FiO2.  Using PMV without difficulty with no distress noted at this time.  Medications: Reviewed on Rounds  Physical Exam:  Vitals: Pulse 100 respirations 27 BP 100/62 O2 sat 100% temp 98.7  Ventilator Settings ATC 28%  . General: Comfortable at this time . Eyes: Grossly normal lids, irises & conjunctiva . ENT: grossly tongue is normal . Neck: no obvious mass . Cardiovascular: S1 S2 normal no gallop . Respiratory: No rales or rhonchi noted . Abdomen: soft . Skin: no rash seen on limited exam . Musculoskeletal: not rigid . Psychiatric:unable to assess . Neurologic: no seizure no involuntary movements         Lab Data:   Basic Metabolic Panel: Recent Labs  Lab 09/29/18 0546 10/02/18 0841 10/03/18 0247  NA 143 144  --   K 3.6 3.2* 3.5  CL 109 103  --   CO2 26 30  --   GLUCOSE 158* 103*  --   BUN 19 19  --   CREATININE 0.65 0.61  --   CALCIUM 8.1* 8.1*  --   MG 1.9  --   --   PHOS 2.8  --   --     ABG: No results for input(s): PHART, PCO2ART, PO2ART, HCO3, O2SAT in the last 168 hours.  Liver Function Tests: Recent Labs  Lab 09/29/18 0546  AST 19  ALT 20  ALKPHOS 336*  BILITOT 1.4*  PROT 5.3*  ALBUMIN 1.9*   No results for input(s): LIPASE, AMYLASE in the last 168 hours. No results for input(s): AMMONIA in the last 168 hours.  CBC: Recent Labs  Lab 09/29/18 0546  10/02/18 2053 10/03/18 0042 10/03/18 1627 10/04/18 0554 10/04/18 1812 10/05/18 0731  WBC 19.9*   < > 18.2*  --  18.3* 18.6* 17.6* 17.9*  NEUTROABS 16.4*  --   --   --   --   --   --    --   HGB 7.1*   < > 7.4* 6.6* 9.9* 7.7* 7.2* 6.0*  HCT 22.4*   < > 22.3* 20.4* 29.3* 23.1* 22.3* 19.1*  MCV 98.2   < > 91.0  --  90.7 90.6 92.1 93.2  PLT 370   < > 384  --  348 350 371 368   < > = values in this interval not displayed.    Cardiac Enzymes: No results for input(s): CKTOTAL, CKMB, CKMBINDEX, TROPONINI in the last 168 hours.  BNP (last 3 results) Recent Labs    10/01/18 0600  BNP 593.8*    ProBNP (last 3 results) No results for input(s): PROBNP in the last 8760 hours.  Radiological Exams: Ct Abdomen Pelvis W Contrast  Result Date: 10/03/2018 CLINICAL DATA:  Complex medical-surgical history. Repair of gastric perforation in March with small-bowel resection, RIGHT hemicolectomy with ileocolonic anastomosis followed several days later by a splenic flexure takedown, colon resection with Hartmann's pouch, ileostomy, jejunostomy revision and omentectomy. Now having GI bleeding/anemia. EXAM: CT ABDOMEN AND PELVIS WITH CONTRAST TECHNIQUE: Multidetector CT imaging of the abdomen and pelvis was performed using the standard protocol following bolus administration of  intravenous contrast. CONTRAST:  131mL OMNIPAQUE IOHEXOL 300 MG/ML  SOLN COMPARISON:  None. FINDINGS: Lower chest: Small ill-defined consolidation at the medial aspects of the RIGHT lung base, atelectasis versus pneumonia. Hepatobiliary: Status post cholecystectomy. No focal liver abnormality identified. Fluid collection within the gallbladder fossa and along the undersurface of the LEFT hepatic lobe, measuring approximately 5 x 2 cm and 6 x 3 cm respectively (axial series 3, images 22 and 29). Pancreas: Unremarkable. No pancreatic ductal dilatation or surrounding inflammatory changes. Spleen: Normal in size without focal abnormality. Adrenals/Urinary Tract: Adrenal glands appear normal. Kidneys are unremarkable without mass, stone or hydronephrosis. Bladder is decompressed by Foley catheter. Stomach/Bowel: Surgical changes of  partial gastric resection with gastrojejunostomy. Additional surgical changes of partial colectomy at the level of the transverse colon. There is a RIGHT lower quadrant ostomy. Percutaneous T shaped jejunostomy tube appears appropriately positioned within the LEFT lower abdomen, with midline anterior abdominal wall entrance. No dilated bowel loops. Bowel contrast to the level of the rectum is presumably residual from a previous exam. Vascular/Lymphatic: Aortic atherosclerosis. No acute appearing vascular abnormality. No active hemorrhage/contrast extravasation identified. No enlarged lymph nodes seen in the abdomen or pelvis. Reproductive: Presumed hysterectomy. No adnexal mass. Other: Multiloculated fluid collection within the LEFT upper quadrant, underlying the spleen, measuring approximately 6 cm transverse dimension and approximately 10 cm craniocaudal dimension (coronal series 6, image 46; axial series 3, image 30). These collections appear near simple fluid density based on CT density measurements. No hyperdense fluid within the abdomen or pelvis to suggest acute or recent hemorrhage. No free intraperitoneal air. Musculoskeletal: No acute or suspicious osseous finding. Degenerative changes throughout the thoracolumbar spine, mild to moderate in degree. Ill-defined edema within the superficial subcutaneous soft tissues suggesting some degree of anasarca. Two drainage catheters are present that terminate within the subcutaneous soft tissues of the anterior abdominal wall and RIGHT lateral abdominal wall. IMPRESSION: 1. Multiloculated fluid collections within the left upper quadrant, underlying the spleen, overall measuring approximately 10 x 6 cm, with simple appearing fluid density, most suggestive of old hematoma or seroma. No air within these collections or other confirming signs of abscess. Similar appearing fluid collections in the gallbladder fossa and underlying the LEFT liver lobe, with measurements  given above. Additional small amount of free fluid in the lower abdomen and pelvis. 2. No active hemorrhage/contrast extravasation appreciated. No evidence of acute or recent hemorrhage within the abdomen or pelvis. 3. Right lower quadrant ostomy, with additional surgical changes of partial colectomy and gastrojejunostomy. No evidence of anastomotic leak. No bowel obstruction. Oral contrast to the level of the rectum is presumably residual from previous exam. 4. Percutaneous T shaped jejunostomy tube appears appropriately positioned, with midline anterior abdominal wall entrance. 5. Small ill-defined consolidation at the medial aspects of the right lung base, atelectasis versus pneumonia. 6. Probable anasarca. 7. Additional chronic/incidental findings detailed above. Aortic Atherosclerosis (ICD10-I70.0). Electronically Signed   By: Franki Cabot M.D.   On: 10/03/2018 19:23    Assessment/Plan Active Problems:   Acute on chronic respiratory failure with hypoxia (HCC)   Influenza A with pneumonia   Hepatic steatosis   Chronic diastolic heart failure (HCC)   Gastric perforation, acute   Melena   Heme positive stool   Acute blood loss anemia   1. Acute on chronic respiratory failure with hypoxia doing well on aerosol trach collar 28% with a 16-hour goal today.  Using PMV with no difficulty.  Continue secretion management pulmonary toilet. 2. Community-acquired pneumonia  continue present therapy 3. Hepatic steatosis without history of alcohol abuse continue to monitor 4. Chronic diastolic heart failure currently at baseline continue supportive care 5. Gastric perforation post surgery continue supportive measures   I have personally seen and evaluated the patient, evaluated laboratory and imaging results, formulated the assessment and plan and placed orders. The Patient requires high complexity decision making for assessment and support.  Case was discussed on Rounds with the Respiratory Therapy  Staff  Allyne Gee, MD [facility] Pulmonary Critical Care Medicine Sleep Medicine

## 2018-10-05 NOTE — Interval H&P Note (Signed)
History and Physical Interval Note: For EGD with MAC to eval intermittent melena per ileostomy and need for frequent blood transfusion Hgb 6.0 this morning.  [facility] provider has ordered 2 additional units of pRBCs today.  HIGHER THAN BASELINE RISK.The nature of the procedure, as well as the risks, benefits, and alternatives were carefully and thoroughly reviewed with the patient. Ample time for discussion and questions allowed. The patient understood, was satisfied, and agreed to proceed.     CBC Latest Ref Rng & Units 10/05/2018 10/04/2018 10/04/2018  WBC 4.0 - 10.5 K/uL 17.9(H) 17.6(H) 18.6(H)  Hemoglobin 12.0 - 15.0 g/dL 6.0(LL) 7.2(L) 7.7(L)  Hematocrit 36.0 - 46.0 % 19.1(L) 22.3(L) 23.1(L)  Platelets 150 - 400 K/uL 368 371 350     10/05/2018 11:17 AM  Patsy Baltimore  has presented today for surgery, with the diagnosis of anemia, rule out upper gi bleed with intermittent dark ostomy output.  The various methods of treatment have been discussed with the patient and family. After consideration of risks, benefits and other options for treatment, the patient has consented to  Procedure(s): ESOPHAGOGASTRODUODENOSCOPY (EGD) WITH PROPOFOL (N/A) as a surgical intervention.  The patient's history has been reviewed, patient examined, no change in status, stable for surgery.  I have reviewed the patient's chart and labs.  Questions were answered to the patient's satisfaction.     Lajuan Lines Cartina Brousseau

## 2018-10-05 NOTE — Op Note (Signed)
[facility] Patient Name: Pamela Curry Procedure Date : 10/05/2018 MRN: [id number] Attending MD: Jerene Bears , MD Date of Birth: [birth year] CSN: [id number] Age: 63 Admit Type: Inpatient Procedure:                Upper GI endoscopy Indications:              Melena, Recent gastrointestinal bleeding requiring                            multiple red cell transfusions Providers:                Lajuan Lines. Hilarie Fredrickson, MD, Baird Cancer, RN, Glori Bickers,                            RN, Marguerita Merles, Technician, Charolette Child,                            Technician, Neldon Newport CRNA, CRNA Referring MD:             [facility] Medicines:                Monitored Anesthesia Care Complications:            No immediate complications. Estimated Blood Loss:     Estimated blood loss: none. Procedure:                Pre-Anesthesia Assessment:                           - Prior to the procedure, a History and Physical                            was performed, and patient medications and                            allergies were reviewed. The patient's tolerance of                            previous anesthesia was also reviewed. The risks                            and benefits of the procedure and the sedation                            options and risks were discussed with the patient.                            All questions were answered, and informed consent                            was obtained. Prior Anticoagulants: The patient has                            taken no previous anticoagulant or antiplatelet  agents. ASA Grade Assessment: IV - A patient with                            severe systemic disease that is a constant threat                            to life. After reviewing the risks and benefits,                            the patient was deemed in satisfactory condition to                            undergo the procedure.                     After obtaining informed consent, the endoscope was                            passed under direct vision. Throughout the                            procedure, the patient's blood pressure, pulse, and                            oxygen saturations were monitored continuously. The                            [id number]-[id number] ([id number] Olympus gastroscope was                            introduced through the mouth, and advanced to the                            efferent jejunal loop. The upper GI endoscopy was                            accomplished without difficulty. The patient                            tolerated the procedure well. Scope In: Scope Out: Findings:      Normal mucosa was found in the entire esophagus.      A 2 cm hiatal hernia was present.      Evidence of a gastrojejunostomy was found in the stomach. The mucosa of       the gastric pouch appears normal. No evidence for ulceration, active or       recent bleeding. The gastrojejunal anastomosis is intact and healthy       appearing.      The examined mucosa of the efferent jejunal limb was normal. There was       no evidence of active or recent bleeding in the examined upper GI tract. Impression:               - Normal mucosa was found in the entire esophagus.                           -  2 cm hiatal hernia.                           - A gastrojejunostomy was found, characterized by                            healthy appearing mucosa.                           - Normal examined jejunum.                           - No source for GI bleeding found in the examined                            upper GI tract.                           - No specimens collected. Moderate Sedation:      N/A Recommendation:           - Return patient to hospital ward for ongoing care.                           - Resume previous diet.                           - Continue present medications.                           - Give additional 2  units red cell transfusion                            today. Transfuse as needed to maintain Hgb > 7.0.                           - Closely monitor hemoglobin.                           - In the event of recurrent melena/ongoing GI                            bleeding I would recommend a tagged red cell study                            to help localize source for GI bleeding. She has                            multiple surgical anastomoses from prior bowel                            resections which are possible gastrointestinal                            sources. Procedure Code(s):        --- Professional ---  10626, Esophagogastroduodenoscopy, flexible,                            transoral; diagnostic, including collection of                            specimen(s) by brushing or washing, when performed                            (separate procedure) Diagnosis Code(s):        --- Professional ---                           K44.9, Diaphragmatic hernia without obstruction or                            gangrene                           Z98.0, Intestinal bypass and anastomosis status                           K92.1, Melena (includes Hematochezia)                           K92.2, Gastrointestinal hemorrhage, unspecified CPT copyright 2019 [employer]. All rights reserved. The codes documented in this report are preliminary and upon coder review may  be revised to meet current compliance requirements. Jerene Bears, MD 10/05/2018 12:10:02 PM This report has been signed electronically. Number of Addenda: 0

## 2018-10-06 DIAGNOSIS — K760 Fatty (change of) liver, not elsewhere classified: Secondary | ICD-10-CM | POA: Diagnosis not present

## 2018-10-06 DIAGNOSIS — I5032 Chronic diastolic (congestive) heart failure: Secondary | ICD-10-CM | POA: Diagnosis not present

## 2018-10-06 DIAGNOSIS — K3189 Other diseases of stomach and duodenum: Secondary | ICD-10-CM | POA: Diagnosis not present

## 2018-10-06 DIAGNOSIS — J9621 Acute and chronic respiratory failure with hypoxia: Secondary | ICD-10-CM | POA: Diagnosis not present

## 2018-10-06 LAB — CBC
HCT: 22.5 % — ABNORMAL LOW (ref 36.0–46.0)
Hemoglobin: 7.6 g/dL — ABNORMAL LOW (ref 12.0–15.0)
MCH: 31.1 pg (ref 26.0–34.0)
MCHC: 33.8 g/dL (ref 30.0–36.0)
MCV: 92.2 fL (ref 80.0–100.0)
Platelets: 379 10*3/uL (ref 150–400)
RBC: 2.44 MIL/uL — ABNORMAL LOW (ref 3.87–5.11)
RDW: 18.5 % — ABNORMAL HIGH (ref 11.5–15.5)
WBC: 17.1 10*3/uL — ABNORMAL HIGH (ref 4.0–10.5)
nRBC: 0 % (ref 0.0–0.2)

## 2018-10-06 LAB — BPAM RBC
Blood Product Expiration Date: 202004182359
Blood Product Expiration Date: 202004232359
Blood Product Expiration Date: 202004252359
Blood Product Expiration Date: 202004252359
ISSUE DATE / TIME: 202004120422
ISSUE DATE / TIME: 202004120946
ISSUE DATE / TIME: 202004141352
ISSUE DATE / TIME: 202004141751
Unit Type and Rh: 6200
Unit Type and Rh: 6200
Unit Type and Rh: 6200
Unit Type and Rh: 6200

## 2018-10-06 LAB — TYPE AND SCREEN
ABO/RH(D): A POS
Antibody Screen: NEGATIVE
Unit division: 0
Unit division: 0
Unit division: 0
Unit division: 0

## 2018-10-06 NOTE — Progress Notes (Addendum)
Pulmonary Critical Care Medicine [place]   PULMONARY CRITICAL CARE SERVICE  PROGRESS NOTE  Date of Service: 10/06/2018  Pamela Curry  AOZ:[id number]  DOB: 1955/10/23   DOA: 09/28/2018  Referring Physician: Merton Border, MD  HPI: Pamela Curry is a 63 y.o. female seen for follow up of Acute on Chronic Respiratory Failure.  Patient remains on aerosol trach collar with a 24-hour goal today.  Overall patient is doing well with no distress.  Medications: Reviewed on Rounds  Physical Exam:  Vitals: Pulse 99 respirations 22 BP 133/63 O2 sat on percent temp 98.6  Ventilator Settings ATC 28%  . General: Comfortable at this time . Eyes: Grossly normal lids, irises & conjunctiva . ENT: grossly tongue is normal . Neck: no obvious mass . Cardiovascular: S1 S2 normal no gallop . Respiratory: No rales rhonchi noted . Abdomen: soft . Skin: no rash seen on limited exam . Musculoskeletal: not rigid . Psychiatric:unable to assess . Neurologic: no seizure no involuntary movements         Lab Data:   Basic Metabolic Panel: Recent Labs  Lab 10/02/18 0841 10/03/18 0247  NA 144  --   K 3.2* 3.5  CL 103  --   CO2 30  --   GLUCOSE 103*  --   BUN 19  --   CREATININE 0.61  --   CALCIUM 8.1*  --     ABG: No results for input(s): PHART, PCO2ART, PO2ART, HCO3, O2SAT in the last 168 hours.  Liver Function Tests: No results for input(s): AST, ALT, ALKPHOS, BILITOT, PROT, ALBUMIN in the last 168 hours. No results for input(s): LIPASE, AMYLASE in the last 168 hours. No results for input(s): AMMONIA in the last 168 hours.  CBC: Recent Labs  Lab 10/04/18 0554 10/04/18 1812 10/05/18 0731 10/05/18 2302 10/06/18 0629  WBC 18.6* 17.6* 17.9* 17.1* 17.1*  HGB 7.7* 7.2* 6.0* 8.3* 7.6*  HCT 23.1* 22.3* 19.1* 24.7* 22.5*  MCV 90.6 92.1 93.2 90.8 92.2  PLT 350 371 368 328 379    Cardiac Enzymes: No results for input(s): CKTOTAL, CKMB, CKMBINDEX, TROPONINI in the  last 168 hours.  BNP (last 3 results) Recent Labs    10/01/18 0600  BNP 593.8*    ProBNP (last 3 results) No results for input(s): PROBNP in the last 8760 hours.  Radiological Exams: No results found.  Assessment/Plan Active Problems:   Acute on chronic respiratory failure with hypoxia (HCC)   Influenza A with pneumonia   Hepatic steatosis   Chronic diastolic heart failure (HCC)   Gastric perforation, acute   Melena   Heme positive stool   Acute blood loss anemia   1. Acute on chronic respiratory failure with hypoxia patient is doing well 28% aerosol trach collar 20-hour goal today.  Using PMV without difficulty we will continue pulmonary toilet and secretion management 2. Community-acquired pneumonia continue present therapy 3. Hepatic steatosis history of alcohol abuse continue to monitor 4. Chronic diastolic heart failure currently at baseline continue supportive care 5. Gastric perforation post surgery continue supportive measures   I have personally seen and evaluated the patient, evaluated laboratory and imaging results, formulated the assessment and plan and placed orders. The Patient requires high complexity decision making for assessment and support.  Case was discussed on Rounds with the Respiratory Therapy Staff  Allyne Gee, MD [facility] Pulmonary Critical Care Medicine Sleep Medicine

## 2018-10-07 ENCOUNTER — Encounter (HOSPITAL_COMMUNITY): Payer: Self-pay | Admitting: Internal Medicine

## 2018-10-07 DIAGNOSIS — K3189 Other diseases of stomach and duodenum: Secondary | ICD-10-CM | POA: Diagnosis not present

## 2018-10-07 DIAGNOSIS — J9621 Acute and chronic respiratory failure with hypoxia: Secondary | ICD-10-CM | POA: Diagnosis not present

## 2018-10-07 DIAGNOSIS — I5032 Chronic diastolic (congestive) heart failure: Secondary | ICD-10-CM | POA: Diagnosis not present

## 2018-10-07 DIAGNOSIS — K760 Fatty (change of) liver, not elsewhere classified: Secondary | ICD-10-CM | POA: Diagnosis not present

## 2018-10-07 LAB — BASIC METABOLIC PANEL
Anion gap: 10 (ref 5–15)
BUN: 20 mg/dL (ref 8–23)
CO2: 22 mmol/L (ref 22–32)
Calcium: 8 mg/dL — ABNORMAL LOW (ref 8.9–10.3)
Chloride: 108 mmol/L (ref 98–111)
Creatinine, Ser: 0.79 mg/dL (ref 0.44–1.00)
GFR calc Af Amer: >60 mL/min (ref >60)
GFR calc non Af Amer: >60 mL/min (ref >60)
Glucose, Bld: 120 mg/dL — ABNORMAL HIGH (ref 70–99)
Potassium: 3 mmol/L — ABNORMAL LOW (ref 3.5–5.1)
Sodium: 140 mmol/L (ref 135–145)

## 2018-10-07 LAB — CBC
HCT: 21.5 % — ABNORMAL LOW (ref 36.0–46.0)
Hemoglobin: 7.1 g/dL — ABNORMAL LOW (ref 12.0–15.0)
MCH: 31 pg (ref 26.0–34.0)
MCHC: 33 g/dL (ref 30.0–36.0)
MCV: 93.9 fL (ref 80.0–100.0)
Platelets: 393 10*3/uL (ref 150–400)
RBC: 2.29 MIL/uL — ABNORMAL LOW (ref 3.87–5.11)
RDW: 18.3 % — ABNORMAL HIGH (ref 11.5–15.5)
WBC: 15.8 10*3/uL — ABNORMAL HIGH (ref 4.0–10.5)
nRBC: 0 % (ref 0.0–0.2)

## 2018-10-07 NOTE — Progress Notes (Addendum)
Pamela Curry   [facility] CRITICAL CARE SERVICE  PROGRESS NOTE  Date of Service: 10/07/2018  Pamela Curry  MVH:[id number]  DOB: [birth year]   DOA: 09/28/2018  Referring Physician: Merton Border, MD  HPI: Pamela Curry is a 63 y.o. female seen for follow up of Acute on Chronic Respiratory Failure.  Patient remains on aerosol trach collar 28% FiO2.  Has a goal of 24 hours today on aerosol trach collar.  Currently doing well satting in the mid 90s.  Medications: Reviewed on Rounds  Physical Exam:  Vitals: Pulse 94 respirations 22 BP 138/88 O2 sat 100% temp 98.0  Ventilator Settings ATC 28%  . General: Comfortable at this time . Eyes: Grossly normal lids, irises & conjunctiva . ENT: grossly tongue is normal . Neck: no obvious mass . Cardiovascular: S1 S2 normal no gallop . Respiratory: No rales or rhonchi noted . Abdomen: soft . Skin: no rash seen on limited exam . Musculoskeletal: not rigid . Psychiatric:unable to assess . Neurologic: no seizure no involuntary movements         Lab Data:   Basic Metabolic Panel: Recent Labs  Lab 10/02/18 0841 10/03/18 0247 10/07/18 0537  NA 144  --  140  K 3.2* 3.5 3.0*  CL 103  --  108  CO2 30  --  22  GLUCOSE 103*  --  120*  BUN 19  --  20  CREATININE 0.61  --  0.79  CALCIUM 8.1*  --  8.0*    ABG: No results for input(s): PHART, PCO2ART, PO2ART, HCO3, O2SAT in the last 168 hours.  Liver Function Tests: No results for input(s): AST, ALT, ALKPHOS, BILITOT, PROT, ALBUMIN in the last 168 hours. No results for input(s): LIPASE, AMYLASE in the last 168 hours. No results for input(s): AMMONIA in the last 168 hours.  CBC: Recent Labs  Lab 10/04/18 1812 10/05/18 0731 10/05/18 2302 10/06/18 0629 10/07/18 0537  WBC 17.6* 17.9* 17.1* 17.1* 15.8*  HGB 7.2* 6.0* 8.3* 7.6* 7.1*  HCT 22.3* 19.1* 24.7* 22.5* 21.5*  MCV 92.1 93.2 90.8 92.2 93.9  PLT 371 368 328 379 393    Cardiac  Enzymes: No results for input(s): CKTOTAL, CKMB, CKMBINDEX, TROPONINI in the last 168 hours.  BNP (last 3 results) Recent Labs    10/01/18 0600  BNP 593.8*    ProBNP (last 3 results) No results for input(s): PROBNP in the last 8760 hours.  Radiological Exams: No results found.  Assessment/Plan Active Problems:   Acute on chronic respiratory failure with hypoxia (HCC)   Influenza A with pneumonia   Hepatic steatosis   Chronic diastolic heart failure (HCC)   Gastric perforation, acute   Melena   Heme positive stool   Acute blood loss anemia   1. Acute on chronic respiratory failure with hypoxia patient continues to do well on 28% aerosol trach collar.  Goal today is 24 hours. 2. Community-acquired pneumonia continue present therapy 3. Hepatic steatosis history of alcohol abuse continue to monitor 4. Chronic diastolic heart failure currently at baseline continue supportive care 5. Gastric perforation post surgery continue supportive measures   I have personally seen and evaluated the patient, evaluated laboratory and imaging results, formulated the assessment and plan and placed orders. The Patient requires high complexity decision making for assessment and support.  Case was discussed on Rounds with the Respiratory Therapy Staff  Allyne Gee, MD [facility] Critical Care Medicine Sleep Medicine

## 2018-10-08 DIAGNOSIS — I5032 Chronic diastolic (congestive) heart failure: Secondary | ICD-10-CM | POA: Diagnosis not present

## 2018-10-08 DIAGNOSIS — K3189 Other diseases of stomach and duodenum: Secondary | ICD-10-CM | POA: Diagnosis not present

## 2018-10-08 DIAGNOSIS — J9621 Acute and chronic respiratory failure with hypoxia: Secondary | ICD-10-CM | POA: Diagnosis not present

## 2018-10-08 DIAGNOSIS — K760 Fatty (change of) liver, not elsewhere classified: Secondary | ICD-10-CM | POA: Diagnosis not present

## 2018-10-08 LAB — HEMOGLOBIN AND HEMATOCRIT, BLOOD
HCT: 23.4 % — ABNORMAL LOW (ref 36.0–46.0)
Hemoglobin: 7.2 g/dL — ABNORMAL LOW (ref 12.0–15.0)

## 2018-10-08 LAB — POTASSIUM: Potassium: 3.9 mmol/L (ref 3.5–5.1)

## 2018-10-08 NOTE — Progress Notes (Addendum)
Pulmonary Critical Care Medicine [place]   PULMONARY CRITICAL CARE SERVICE  PROGRESS NOTE  Date of Service: 10/09/2018  Basha Krygier  HYQ:[id number]  DOB: 1955-07-29   DOA: 09/28/2018  Referring Physician: Merton Border, MD  HPI: Pamela Curry is a 63 y.o. female seen for follow up of Acute on Chronic Respiratory Failure.  Patient's aerosol trach collar weaned to 21% yesterday doing well at this time.  Patient has been on 21% for 24 hours and is going for 48-hour goal at this time.  Has good saturations with no acute distress.  Medications: Reviewed on Rounds  Physical Exam:  Vitals: Pulse 101 respirations 22 BP 111/80 O2 sat 99% temp 97.5  Ventilator Settings ATC 21%  . General: Comfortable at this time . Eyes: Grossly normal lids, irises & conjunctiva . ENT: grossly tongue is normal . Neck: no obvious mass . Cardiovascular: S1 S2 normal no gallop . Respiratory: No rales or rhonchi noted . Abdomen: soft . Skin: no rash seen on limited exam . Musculoskeletal: not rigid . Psychiatric:unable to assess . Neurologic: no seizure no involuntary movements         Lab Data:   Basic Metabolic Panel: Recent Labs  Lab 10/03/18 0247 10/07/18 0537 10/08/18 0556  NA  --  140  --   K 3.5 3.0* 3.9  CL  --  108  --   CO2  --  22  --   GLUCOSE  --  120*  --   BUN  --  20  --   CREATININE  --  0.79  --   CALCIUM  --  8.0*  --     ABG: No results for input(s): PHART, PCO2ART, PO2ART, HCO3, O2SAT in the last 168 hours.  Liver Function Tests: No results for input(s): AST, ALT, ALKPHOS, BILITOT, PROT, ALBUMIN in the last 168 hours. No results for input(s): LIPASE, AMYLASE in the last 168 hours. No results for input(s): AMMONIA in the last 168 hours.  CBC: Recent Labs  Lab 10/05/18 0731 10/05/18 2302 10/06/18 0629 10/07/18 0537 10/08/18 0556 10/09/18 1108  WBC 17.9* 17.1* 17.1* 15.8*  --  15.2*  HGB 6.0* 8.3* 7.6* 7.1* 7.2* 7.6*  HCT 19.1* 24.7*  22.5* 21.5* 23.4* 23.8*  MCV 93.2 90.8 92.2 93.9  --  96.7  PLT 368 328 379 393  --  486*    Cardiac Enzymes: No results for input(s): CKTOTAL, CKMB, CKMBINDEX, TROPONINI in the last 168 hours.  BNP (last 3 results) Recent Labs    10/01/18 0600  BNP 593.8*    ProBNP (last 3 results) No results for input(s): PROBNP in the last 8760 hours.  Radiological Exams: No results found.  Assessment/Plan Active Problems:   Acute on chronic respiratory failure with hypoxia (HCC)   Influenza A with pneumonia   Hepatic steatosis   Chronic diastolic heart failure (HCC)   Gastric perforation, acute   Melena   Heme positive stool   Acute blood loss anemia   1. Acute on chronic respiratory failure with hypoxia patient is doing well on 20% aerosol trach collar at this time.  Goal is 48 hours. 2. Mainly acquired pneumonia continue present therapy 3. Hepatic steatosis history of alcohol abuse continue to monitor 4. Chronic diastolic heart failure currently at baseline continue supportive care 5. Gastric perforation post surgery continue supportive measures   I have personally seen and evaluated the patient, evaluated laboratory and imaging results, formulated the assessment and plan and placed orders. The  Patient requires high complexity decision making for assessment and support.  Case was discussed on Rounds with the Respiratory Therapy Staff  Allyne Gee, MD [facility] Pulmonary Critical Care Medicine Sleep Medicine

## 2018-10-09 DIAGNOSIS — I5032 Chronic diastolic (congestive) heart failure: Secondary | ICD-10-CM | POA: Diagnosis not present

## 2018-10-09 DIAGNOSIS — J9621 Acute and chronic respiratory failure with hypoxia: Secondary | ICD-10-CM | POA: Diagnosis not present

## 2018-10-09 DIAGNOSIS — K3189 Other diseases of stomach and duodenum: Secondary | ICD-10-CM | POA: Diagnosis not present

## 2018-10-09 DIAGNOSIS — K760 Fatty (change of) liver, not elsewhere classified: Secondary | ICD-10-CM | POA: Diagnosis not present

## 2018-10-09 LAB — CBC
HCT: 23.8 % — ABNORMAL LOW (ref 36.0–46.0)
Hemoglobin: 7.6 g/dL — ABNORMAL LOW (ref 12.0–15.0)
MCH: 30.9 pg (ref 26.0–34.0)
MCHC: 31.9 g/dL (ref 30.0–36.0)
MCV: 96.7 fL (ref 80.0–100.0)
Platelets: 486 10*3/uL — ABNORMAL HIGH (ref 150–400)
RBC: 2.46 MIL/uL — ABNORMAL LOW (ref 3.87–5.11)
RDW: 18.3 % — ABNORMAL HIGH (ref 11.5–15.5)
WBC: 15.2 10*3/uL — ABNORMAL HIGH (ref 4.0–10.5)
nRBC: 0 % (ref 0.0–0.2)

## 2018-10-09 NOTE — Progress Notes (Addendum)
Pulmonary Critical Care Medicine [place]   PULMONARY CRITICAL CARE SERVICE  PROGRESS NOTE  Date of Service: 10/09/2018  Pamela Curry  KVQ:[id number]  DOB: [birth year]   DOA: 09/28/2018  Referring Physician: Merton Border, MD  HPI: Pamela Curry is a 63 y.o. female seen for follow up of Acute on Chronic Respiratory Failure.  Patient has a 48-hour goal currently on 21% aerosol trach collar.  Doing well at this time with no fever or distress noted.  Medications: Reviewed on Rounds  Physical Exam:  Vitals: Pulse 100 respirations 24 BP 110/81 O2 sat 97% temp 97.5  Ventilator Settings ATC 21  . General: Comfortable at this time . Eyes: Grossly normal lids, irises & conjunctiva . ENT: grossly tongue is normal . Neck: no obvious mass . Cardiovascular: S1 S2 normal no gallop . Respiratory: No rales or rhonchi noted . Abdomen: soft . Skin: no rash seen on limited exam . Musculoskeletal: not rigid . Psychiatric:unable to assess . Neurologic: no seizure no involuntary movements         Lab Data:   Basic Metabolic Panel: Recent Labs  Lab 10/03/18 0247 10/07/18 0537 10/08/18 0556  NA  --  140  --   K 3.5 3.0* 3.9  CL  --  108  --   CO2  --  22  --   GLUCOSE  --  120*  --   BUN  --  20  --   CREATININE  --  0.79  --   CALCIUM  --  8.0*  --     ABG: No results for input(s): PHART, PCO2ART, PO2ART, HCO3, O2SAT in the last 168 hours.  Liver Function Tests: No results for input(s): AST, ALT, ALKPHOS, BILITOT, PROT, ALBUMIN in the last 168 hours. No results for input(s): LIPASE, AMYLASE in the last 168 hours. No results for input(s): AMMONIA in the last 168 hours.  CBC: Recent Labs  Lab 10/05/18 0731 10/05/18 2302 10/06/18 0629 10/07/18 0537 10/08/18 0556 10/09/18 1108  WBC 17.9* 17.1* 17.1* 15.8*  --  15.2*  HGB 6.0* 8.3* 7.6* 7.1* 7.2* 7.6*  HCT 19.1* 24.7* 22.5* 21.5* 23.4* 23.8*  MCV 93.2 90.8 92.2 93.9  --  96.7  PLT 368 328 379 393  --   486*    Cardiac Enzymes: No results for input(s): CKTOTAL, CKMB, CKMBINDEX, TROPONINI in the last 168 hours.  BNP (last 3 results) Recent Labs    10/01/18 0600  BNP 593.8*    ProBNP (last 3 results) No results for input(s): PROBNP in the last 8760 hours.  Radiological Exams: No results found.  Assessment/Plan Active Problems:   Acute on chronic respiratory failure with hypoxia (HCC)   Influenza A with pneumonia   Hepatic steatosis   Chronic diastolic heart failure (HCC)   Gastric perforation, acute   Melena   Heme positive stool   Acute blood loss anemia   1. Acute on chronic respiratory failure with hypoxia doing well on 21% aerosol trach collar at this time.  Current goal is 48 hours.  Continue pulmonary toilet and secretion management 2. Community-acquired pneumonia continue present therapy 3. Hepatic steatosis history of alcohol abuse continue to monitor 4. Chronic diastolic heart failure currently at baseline continue supportive care 5. Gastric perforation post surgery continue with measures   I have personally seen and evaluated the patient, evaluated laboratory and imaging results, formulated the assessment and plan and placed orders. The Patient requires high complexity decision making for assessment and support.  Case was discussed on Rounds with the Respiratory Therapy Staff  Allyne Gee, MD [facility] Pulmonary Critical Care Medicine Sleep Medicine

## 2018-10-10 DIAGNOSIS — K3189 Other diseases of stomach and duodenum: Secondary | ICD-10-CM | POA: Diagnosis not present

## 2018-10-10 DIAGNOSIS — J9621 Acute and chronic respiratory failure with hypoxia: Secondary | ICD-10-CM | POA: Diagnosis not present

## 2018-10-10 DIAGNOSIS — K760 Fatty (change of) liver, not elsewhere classified: Secondary | ICD-10-CM | POA: Diagnosis not present

## 2018-10-10 DIAGNOSIS — I5032 Chronic diastolic (congestive) heart failure: Secondary | ICD-10-CM | POA: Diagnosis not present

## 2018-10-10 NOTE — Progress Notes (Addendum)
Pulmonary Critical Care Medicine [place]   PULMONARY CRITICAL CARE SERVICE  PROGRESS NOTE  Date of Service: 10/10/2018  Pamela Curry  ZOX:[id number]  DOB: 12-Aug-1955   DOA: 09/28/2018  Referring Physician: Merton Border, MD  HPI: Pamela Curry is a 62 y.o. female seen for follow up of Acute on Chronic Respiratory Failure.  Patient's trach will be changed to a #6 cuffless today.  And capping trials can begin as tolerated.  Patient remains on aerosol trach at this time 21% FiO2.  Medications: Reviewed on Rounds  Physical Exam:  Vitals: Pulse 95 respirations 20 BP one 6/61 O2 sat 97% temp 98.1  Ventilator Settings ATC 21%  . General: Comfortable at this time . Eyes: Grossly normal lids, irises & conjunctiva . ENT: grossly tongue is normal . Neck: no obvious mass . Cardiovascular: S1 S2 normal no gallop . Respiratory: No rales or rhonchi noted . Abdomen: soft . Skin: no rash seen on limited exam . Musculoskeletal: not rigid . Psychiatric:unable to assess . Neurologic: no seizure no involuntary movements         Lab Data:   Basic Metabolic Panel: Recent Labs  Lab 10/07/18 0537 10/08/18 0556  NA 140  --   K 3.0* 3.9  CL 108  --   CO2 22  --   GLUCOSE 120*  --   BUN 20  --   CREATININE 0.79  --   CALCIUM 8.0*  --     ABG: No results for input(s): PHART, PCO2ART, PO2ART, HCO3, O2SAT in the last 168 hours.  Liver Function Tests: No results for input(s): AST, ALT, ALKPHOS, BILITOT, PROT, ALBUMIN in the last 168 hours. No results for input(s): LIPASE, AMYLASE in the last 168 hours. No results for input(s): AMMONIA in the last 168 hours.  CBC: Recent Labs  Lab 10/05/18 0731 10/05/18 2302 10/06/18 0629 10/07/18 0537 10/08/18 0556 10/09/18 1108  WBC 17.9* 17.1* 17.1* 15.8*  --  15.2*  HGB 6.0* 8.3* 7.6* 7.1* 7.2* 7.6*  HCT 19.1* 24.7* 22.5* 21.5* 23.4* 23.8*  MCV 93.2 90.8 92.2 93.9  --  96.7  PLT 368 328 379 393  --  486*    Cardiac  Enzymes: No results for input(s): CKTOTAL, CKMB, CKMBINDEX, TROPONINI in the last 168 hours.  BNP (last 3 results) Recent Labs    10/01/18 0600  BNP 593.8*    ProBNP (last 3 results) No results for input(s): PROBNP in the last 8760 hours.  Radiological Exams: No results found.  Assessment/Plan Active Problems:   Acute on chronic respiratory failure with hypoxia (HCC)   Influenza A with pneumonia   Hepatic steatosis   Chronic diastolic heart failure (HCC)   Gastric perforation, acute   Melena   Heme positive stool   Acute blood loss anemia   1. Acute on chronic respiratory failure with hypoxia doing well 21% aerosol trach collar.  Patient will begin capping after trach is changed to a #6 cuffless today.  Continue pulmonary toilet and secretion management 2. Community-acquired pneumonia continue present therapy 3. Hepatic steatosis history of alcohol abuse continue to monitor 4. Chronic diastolic heart failure currently at baseline continue supportive care 5. Gastric perforation post surgery continue with supportive measures   I have personally seen and evaluated the patient, evaluated laboratory and imaging results, formulated the assessment and plan and placed orders. The Patient requires high complexity decision making for assessment and support.  Case was discussed on Rounds with the Respiratory Therapy Staff  [facility]  Richardson Dopp, MD [facility] Pulmonary Critical Care Medicine Sleep Medicine

## 2018-10-11 DIAGNOSIS — K760 Fatty (change of) liver, not elsewhere classified: Secondary | ICD-10-CM | POA: Diagnosis not present

## 2018-10-11 DIAGNOSIS — K3189 Other diseases of stomach and duodenum: Secondary | ICD-10-CM | POA: Diagnosis not present

## 2018-10-11 DIAGNOSIS — I5032 Chronic diastolic (congestive) heart failure: Secondary | ICD-10-CM | POA: Diagnosis not present

## 2018-10-11 DIAGNOSIS — J9621 Acute and chronic respiratory failure with hypoxia: Secondary | ICD-10-CM | POA: Diagnosis not present

## 2018-10-11 LAB — CBC
HCT: 21.6 % — ABNORMAL LOW (ref 36.0–46.0)
HCT: 26.5 % — ABNORMAL LOW (ref 36.0–46.0)
Hemoglobin: 6.7 g/dL — CL (ref 12.0–15.0)
Hemoglobin: 8.5 g/dL — ABNORMAL LOW (ref 12.0–15.0)
MCH: 30.6 pg (ref 26.0–34.0)
MCH: 30.9 pg (ref 26.0–34.0)
MCHC: 31 g/dL (ref 30.0–36.0)
MCHC: 32.1 g/dL (ref 30.0–36.0)
MCV: 98.6 fL (ref 80.0–100.0)
MCV: 96.4 fL (ref 80.0–100.0)
Platelets: 574 10*3/uL — ABNORMAL HIGH (ref 150–400)
Platelets: 534 10*3/uL — ABNORMAL HIGH (ref 150–400)
RBC: 2.19 MIL/uL — ABNORMAL LOW (ref 3.87–5.11)
RBC: 2.75 MIL/uL — ABNORMAL LOW (ref 3.87–5.11)
RDW: 18.1 % — ABNORMAL HIGH (ref 11.5–15.5)
RDW: 16.9 % — ABNORMAL HIGH (ref 11.5–15.5)
WBC: 15.8 10*3/uL — ABNORMAL HIGH (ref 4.0–10.5)
WBC: 14.3 10*3/uL — ABNORMAL HIGH (ref 4.0–10.5)
nRBC: 0 % (ref 0.0–0.2)
nRBC: 0 % (ref 0.0–0.2)

## 2018-10-11 LAB — BASIC METABOLIC PANEL
Anion gap: 11 (ref 5–15)
BUN: 29 mg/dL — ABNORMAL HIGH (ref 8–23)
CO2: 16 mmol/L — ABNORMAL LOW (ref 22–32)
Calcium: 8.3 mg/dL — ABNORMAL LOW (ref 8.9–10.3)
Chloride: 109 mmol/L (ref 98–111)
Creatinine, Ser: 0.86 mg/dL (ref 0.44–1.00)
GFR calc Af Amer: >60 mL/min (ref >60)
GFR calc non Af Amer: >60 mL/min (ref >60)
Glucose, Bld: 115 mg/dL — ABNORMAL HIGH (ref 70–99)
Potassium: 3.6 mmol/L (ref 3.5–5.1)
Sodium: 136 mmol/L (ref 135–145)

## 2018-10-11 LAB — PREPARE RBC (CROSSMATCH)

## 2018-10-11 NOTE — Progress Notes (Addendum)
Pulmonary Critical Care Medicine [place]   PULMONARY CRITICAL CARE SERVICE  PROGRESS NOTE  Date of Service: 10/11/2018  Pamela Curry  ZOX:[id number]  DOB: 1955/08/08   DOA: 09/28/2018  Referring Physician: Merton Border, MD  HPI: Pamela Curry is a 63 y.o. female seen for follow up of Acute on Chronic Respiratory Failure.  Patient is capped on room air doing well this time with good saturations.  Medications: Reviewed on Rounds  Physical Exam:  Vitals: Pulse 97 respirations 18 BP 120/68 O2 sat 98% temp 96.9  Ventilator Settings room air  . General: Comfortable at this time . Eyes: Grossly normal lids, irises & conjunctiva . ENT: grossly tongue is normal . Neck: no obvious mass . Cardiovascular: S1 S2 normal no gallop . Respiratory: No rales or rhonchi noted . Abdomen: soft . Skin: no rash seen on limited exam . Musculoskeletal: not rigid . Psychiatric:unable to assess . Neurologic: no seizure no involuntary movements         Lab Data:   Basic Metabolic Panel: Recent Labs  Lab 10/07/18 0537 10/08/18 0556 10/11/18 0829  NA 140  --  136  K 3.0* 3.9 3.6  CL 108  --  109  CO2 22  --  16*  GLUCOSE 120*  --  115*  BUN 20  --  29*  CREATININE 0.79  --  0.86  CALCIUM 8.0*  --  8.3*    ABG: No results for input(s): PHART, PCO2ART, PO2ART, HCO3, O2SAT in the last 168 hours.  Liver Function Tests: No results for input(s): AST, ALT, ALKPHOS, BILITOT, PROT, ALBUMIN in the last 168 hours. No results for input(s): LIPASE, AMYLASE in the last 168 hours. No results for input(s): AMMONIA in the last 168 hours.  CBC: Recent Labs  Lab 10/05/18 2302 10/06/18 0629 10/07/18 0537 10/08/18 0556 10/09/18 1108 10/11/18 0829  WBC 17.1* 17.1* 15.8*  --  15.2* 15.8*  HGB 8.3* 7.6* 7.1* 7.2* 7.6* 6.7*  HCT 24.7* 22.5* 21.5* 23.4* 23.8* 21.6*  MCV 90.8 92.2 93.9  --  96.7 98.6  PLT 328 379 393  --  486* 574*    Cardiac Enzymes: No results for  input(s): CKTOTAL, CKMB, CKMBINDEX, TROPONINI in the last 168 hours.  BNP (last 3 results) Recent Labs    10/01/18 0600  BNP 593.8*    ProBNP (last 3 results) No results for input(s): PROBNP in the last 8760 hours.  Radiological Exams: No results found.  Assessment/Plan Active Problems:   Acute on chronic respiratory failure with hypoxia (HCC)   Influenza A with pneumonia   Hepatic steatosis   Chronic diastolic heart failure (HCC)   Gastric perforation, acute   Melena   Heme positive stool   Acute blood loss anemia   1. Acute on chronic respiratory failure with hypoxia doing well on room air At this time.  Continue secretion management pulmonary toilet 2. Continue acquired pneumonia continue present therapy 3. Hepatic steatosis history of alcohol abuse continue to monitor 4. Chronic diastolic heart failure currently at baseline continue supportive care 5. Gastric perforation post surgery continue supportive measures   I have personally seen and evaluated the patient, evaluated laboratory and imaging results, formulated the assessment and plan and placed orders. The Patient requires high complexity decision making for assessment and support.  Case was discussed on Rounds with the Respiratory Therapy Staff  Allyne Gee, MD [facility] Pulmonary Critical Care Medicine Sleep Medicine

## 2018-10-12 DIAGNOSIS — K760 Fatty (change of) liver, not elsewhere classified: Secondary | ICD-10-CM | POA: Diagnosis not present

## 2018-10-12 DIAGNOSIS — I5032 Chronic diastolic (congestive) heart failure: Secondary | ICD-10-CM | POA: Diagnosis not present

## 2018-10-12 DIAGNOSIS — K3189 Other diseases of stomach and duodenum: Secondary | ICD-10-CM | POA: Diagnosis not present

## 2018-10-12 DIAGNOSIS — J9621 Acute and chronic respiratory failure with hypoxia: Secondary | ICD-10-CM | POA: Diagnosis not present

## 2018-10-12 LAB — TYPE AND SCREEN
ABO/RH(D): A POS
Antibody Screen: NEGATIVE
Unit division: 0

## 2018-10-12 LAB — BPAM RBC
Blood Product Expiration Date: 202005022359
ISSUE DATE / TIME: 202004201232
Unit Type and Rh: 6200

## 2018-10-12 NOTE — Progress Notes (Addendum)
Pulmonary Critical Care Medicine [place]   PULMONARY CRITICAL CARE SERVICE  PROGRESS NOTE  Date of Service: 10/12/2018  Pamela Curry  QIO:[id number]  DOB: Oct 21, 1955   DOA: 09/28/2018  Referring Physician: Merton Border, MD  HPI: Pamela Curry is a 63 y.o. female seen for follow up of Acute on Chronic Respiratory Failure.  Patient has been capped for 48 hours today.  We will continue at this time.  Patient is satting well and is afebrile.  Medications: Reviewed on Rounds  Physical Exam:  Vitals: Pulse 91 respirations 20 BP 118/70 O2 sat 99% temp 97.2  Ventilator Settings room air  . General: Comfortable at this time . Eyes: Grossly normal lids, irises & conjunctiva . ENT: grossly tongue is normal . Neck: no obvious mass . Cardiovascular: S1 S2 normal no gallop . Respiratory: No rales or rhonchi noted . Abdomen: soft . Skin: no rash seen on limited exam . Musculoskeletal: not rigid . Psychiatric:unable to assess . Neurologic: no seizure no involuntary movements         Lab Data:   Basic Metabolic Panel: Recent Labs  Lab 10/07/18 0537 10/08/18 0556 10/11/18 0829  NA 140  --  136  K 3.0* 3.9 3.6  CL 108  --  109  CO2 22  --  16*  GLUCOSE 120*  --  115*  BUN 20  --  29*  CREATININE 0.79  --  0.86  CALCIUM 8.0*  --  8.3*    ABG: No results for input(s): PHART, PCO2ART, PO2ART, HCO3, O2SAT in the last 168 hours.  Liver Function Tests: No results for input(s): AST, ALT, ALKPHOS, BILITOT, PROT, ALBUMIN in the last 168 hours. No results for input(s): LIPASE, AMYLASE in the last 168 hours. No results for input(s): AMMONIA in the last 168 hours.  CBC: Recent Labs  Lab 10/06/18 0629 10/07/18 0537 10/08/18 0556 10/09/18 1108 10/11/18 0829 10/11/18 1723  WBC 17.1* 15.8*  --  15.2* 15.8* 14.3*  HGB 7.6* 7.1* 7.2* 7.6* 6.7* 8.5*  HCT 22.5* 21.5* 23.4* 23.8* 21.6* 26.5*  MCV 92.2 93.9  --  96.7 98.6 96.4  PLT 379 393  --  486* 574* 534*     Cardiac Enzymes: No results for input(s): CKTOTAL, CKMB, CKMBINDEX, TROPONINI in the last 168 hours.  BNP (last 3 results) Recent Labs    10/01/18 0600  BNP 593.8*    ProBNP (last 3 results) No results for input(s): PROBNP in the last 8760 hours.  Radiological Exams: No results found.  Assessment/Plan Active Problems:   Acute on chronic respiratory failure with hypoxia (HCC)   Influenza A with pneumonia   Hepatic steatosis   Chronic diastolic heart failure (HCC)   Gastric perforation, acute   Melena   Heme positive stool   Acute blood loss anemia   1. Acute on chronic respiratory failure with hypoxia patient is doing well on room air with capping.  Continue pulmonary toilet and secretion management.  Plan for decannulation tomorrow 2. Community-acquired pneumonia continue present therapy 3. Hepatic steatosis history of alcohol abuse continue to monitor 4. Chronic diastolic heart failure currently at baseline continue supportive care 5. Gastric perforation post surgery continue supportive measures   I have personally seen and evaluated the patient, evaluated laboratory and imaging results, formulated the assessment and plan and placed orders. The Patient requires high complexity decision making for assessment and support.  Case was discussed on Rounds with the Respiratory Therapy Staff  Allyne Gee, MD [facility]  Pulmonary Critical Care Medicine Sleep Medicine

## 2018-10-13 ENCOUNTER — Other Ambulatory Visit (HOSPITAL_COMMUNITY): Payer: Medicare Other

## 2018-10-13 DIAGNOSIS — J9621 Acute and chronic respiratory failure with hypoxia: Secondary | ICD-10-CM | POA: Diagnosis not present

## 2018-10-13 DIAGNOSIS — K3189 Other diseases of stomach and duodenum: Secondary | ICD-10-CM | POA: Diagnosis not present

## 2018-10-13 DIAGNOSIS — K760 Fatty (change of) liver, not elsewhere classified: Secondary | ICD-10-CM | POA: Diagnosis not present

## 2018-10-13 DIAGNOSIS — I5032 Chronic diastolic (congestive) heart failure: Secondary | ICD-10-CM | POA: Diagnosis not present

## 2018-10-13 NOTE — Progress Notes (Addendum)
Pulmonary Critical Care Medicine [place]   PULMONARY CRITICAL CARE SERVICE  PROGRESS NOTE  Date of Service: 10/13/2018  Halah Whiteside  QVZ:[id number]  DOB: 1955-12-23   DOA: 09/28/2018  Referring Physician: Merton Border, MD  HPI: Pamela Curry is a 63 y.o. female seen for follow up of Acute on Chronic Respiratory Failure.  Patient has been capped and on room air for 3 days.  Currently doing well with no distress.  Medications: Reviewed on Rounds  Physical Exam:  Vitals: Pulse 108 respirations 23 BP 113/65 O2 sat 99% temp 98.0  Ventilator Settings room air  . General: Comfortable at this time . Eyes: Grossly normal lids, irises & conjunctiva . ENT: grossly tongue is normal . Neck: no obvious mass . Cardiovascular: S1 S2 normal no gallop . Respiratory: No rales or rhonchi noted . Abdomen: soft . Skin: no rash seen on limited exam . Musculoskeletal: not rigid . Psychiatric:unable to assess . Neurologic: no seizure no involuntary movements         Lab Data:   Basic Metabolic Panel: Recent Labs  Lab 10/07/18 0537 10/08/18 0556 10/11/18 0829  NA 140  --  136  K 3.0* 3.9 3.6  CL 108  --  109  CO2 22  --  16*  GLUCOSE 120*  --  115*  BUN 20  --  29*  CREATININE 0.79  --  0.86  CALCIUM 8.0*  --  8.3*    ABG: No results for input(s): PHART, PCO2ART, PO2ART, HCO3, O2SAT in the last 168 hours.  Liver Function Tests: No results for input(s): AST, ALT, ALKPHOS, BILITOT, PROT, ALBUMIN in the last 168 hours. No results for input(s): LIPASE, AMYLASE in the last 168 hours. No results for input(s): AMMONIA in the last 168 hours.  CBC: Recent Labs  Lab 10/07/18 0537 10/08/18 0556 10/09/18 1108 10/11/18 0829 10/11/18 1723  WBC 15.8*  --  15.2* 15.8* 14.3*  HGB 7.1* 7.2* 7.6* 6.7* 8.5*  HCT 21.5* 23.4* 23.8* 21.6* 26.5*  MCV 93.9  --  96.7 98.6 96.4  PLT 393  --  486* 574* 534*    Cardiac Enzymes: No results for input(s): CKTOTAL, CKMB,  CKMBINDEX, TROPONINI in the last 168 hours.  BNP (last 3 results) Recent Labs    10/01/18 0600  BNP 593.8*    ProBNP (last 3 results) No results for input(s): PROBNP in the last 8760 hours.  Radiological Exams: Dg Abd 1 View  Result Date: 10/13/2018 CLINICAL DATA:  Generalized abdominal pain. EXAM: ABDOMEN - 1 VIEW COMPARISON:  CT abdomen and pelvis 10/03/2018. Abdomen x-ray 09/28/2018. FINDINGS: Bowel gas pattern unremarkable without evidence of obstruction or significant ileus. Prior subtotal gastrectomy. Contrast material within the residual stomach and the Roux-en-Y loop related to the speech swallow performed earlier this morning. No suggestion of free intraperitoneal air. IMPRESSION: No acute abdominal abnormality. Electronically Signed   By: Evangeline Dakin M.D.   On: 10/13/2018 10:21    Assessment/Plan Active Problems:   Acute on chronic respiratory failure with hypoxia (HCC)   Influenza A with pneumonia   Hepatic steatosis   Chronic diastolic heart failure (HCC)   Gastric perforation, acute   Melena   Heme positive stool   Acute blood loss anemia   1. Acute on chronic respiratory failure with hypoxia doing well on room air at this time with capping.  Continue secretion management pulmonary toilet.  Patient will be decannulated today. 2. Community-acquired pneumonia continue present therapy 3. Hepatic steatosis history  of alcohol abuse continue to monitor 4. Chronic diastolic heart failure currently at baseline continue supportive care 5. Gastric perforation post surgery continue supportive measures   I have personally seen and evaluated the patient, evaluated laboratory and imaging results, formulated the assessment and plan and placed orders. The Patient requires high complexity decision making for assessment and support.  Case was discussed on Rounds with the Respiratory Therapy Staff  Allyne Gee, MD [facility] Pulmonary Critical Care Medicine Sleep Medicine

## 2018-10-14 DIAGNOSIS — K760 Fatty (change of) liver, not elsewhere classified: Secondary | ICD-10-CM | POA: Diagnosis not present

## 2018-10-14 DIAGNOSIS — I5032 Chronic diastolic (congestive) heart failure: Secondary | ICD-10-CM | POA: Diagnosis not present

## 2018-10-14 DIAGNOSIS — J9621 Acute and chronic respiratory failure with hypoxia: Secondary | ICD-10-CM | POA: Diagnosis not present

## 2018-10-14 DIAGNOSIS — K3189 Other diseases of stomach and duodenum: Secondary | ICD-10-CM | POA: Diagnosis not present

## 2018-10-14 NOTE — Progress Notes (Addendum)
Pulmonary Critical Care Medicine [place]   PULMONARY CRITICAL CARE SERVICE  PROGRESS NOTE  Date of Service: 10/14/2018  Pamela Curry  [id number]  DOB: 1956/02/05   DOA: 09/28/2018  Referring Physician: Merton Border, MD  HPI: Pamela Curry is a 63 y.o. female seen for follow up of Acute on Chronic Respiratory Failure.  Patient was decannulated yesterday is currently doing well on room air.  No distress or temperature noted.  Medications: Reviewed on Rounds  Physical Exam:  Vitals: Pulse 98 respirations 18 BP 120/69 O2 sat 97% temp 98.2  Ventilator Settings room air  . General: Comfortable at this time . Eyes: Grossly normal lids, irises & conjunctiva . ENT: grossly tongue is normal . Neck: no obvious mass . Cardiovascular: S1 S2 normal no gallop . Respiratory: No rales or rhonchi noted . Abdomen: soft . Skin: no rash seen on limited exam . Musculoskeletal: not rigid . Psychiatric:unable to assess . Neurologic: no seizure no involuntary movements         Lab Data:   Basic Metabolic Panel: Recent Labs  Lab 10/08/18 0556 10/11/18 0829  NA  --  136  K 3.9 3.6  CL  --  109  CO2  --  16*  GLUCOSE  --  115*  BUN  --  29*  CREATININE  --  0.86  CALCIUM  --  8.3*    ABG: No results for input(s): PHART, PCO2ART, PO2ART, HCO3, O2SAT in the last 168 hours.  Liver Function Tests: No results for input(s): AST, ALT, ALKPHOS, BILITOT, PROT, ALBUMIN in the last 168 hours. No results for input(s): LIPASE, AMYLASE in the last 168 hours. No results for input(s): AMMONIA in the last 168 hours.  CBC: Recent Labs  Lab 10/08/18 0556 10/09/18 1108 10/11/18 0829 10/11/18 1723  WBC  --  15.2* 15.8* 14.3*  HGB 7.2* 7.6* 6.7* 8.5*  HCT 23.4* 23.8* 21.6* 26.5*  MCV  --  96.7 98.6 96.4  PLT  --  486* 574* 534*    Cardiac Enzymes: No results for input(s): CKTOTAL, CKMB, CKMBINDEX, TROPONINI in the last 168 hours.  BNP (last 3 results) Recent Labs     10/01/18 0600  BNP 593.8*    ProBNP (last 3 results) No results for input(s): PROBNP in the last 8760 hours.  Radiological Exams: Dg Abd 1 View  Result Date: 10/13/2018 CLINICAL DATA:  Generalized abdominal pain. EXAM: ABDOMEN - 1 VIEW COMPARISON:  CT abdomen and pelvis 10/03/2018. Abdomen x-ray 09/28/2018. FINDINGS: Bowel gas pattern unremarkable without evidence of obstruction or significant ileus. Prior subtotal gastrectomy. Contrast material within the residual stomach and the Roux-en-Y loop related to the speech swallow performed earlier this morning. No suggestion of free intraperitoneal air. IMPRESSION: No acute abdominal abnormality. Electronically Signed   By: Evangeline Dakin M.D.   On: 10/13/2018 10:21    Assessment/Plan Active Problems:   Acute on chronic respiratory failure with hypoxia (HCC)   Influenza A with pneumonia   Hepatic steatosis   Chronic diastolic heart failure (HCC)   Gastric perforation, acute   Melena   Heme positive stool   Acute blood loss anemia   1. Acute on chronic respiratory failure with hypoxia doing well on room air post decannulation from yesterday.  Continue pulmonary toilet and secretion management. 2. Community-acquired pneumonia continue present therapy 3. Hepatic steatosis history of alcohol abuse continue to monitor 4. Chronic diastolic heart failure currently at baseline continue supportive care 5. Gastric perforation post surgery continue supportive  measures   I have personally seen and evaluated the patient, evaluated laboratory and imaging results, formulated the assessment and plan and placed orders. The Patient requires high complexity decision making for assessment and support.  Case was discussed on Rounds with the Respiratory Therapy Staff  Allyne Gee, MD [facility] Pulmonary Critical Care Medicine Sleep Medicine

## 2018-10-15 DIAGNOSIS — K760 Fatty (change of) liver, not elsewhere classified: Secondary | ICD-10-CM | POA: Diagnosis not present

## 2018-10-15 DIAGNOSIS — I5032 Chronic diastolic (congestive) heart failure: Secondary | ICD-10-CM | POA: Diagnosis not present

## 2018-10-15 DIAGNOSIS — K3189 Other diseases of stomach and duodenum: Secondary | ICD-10-CM | POA: Diagnosis not present

## 2018-10-15 DIAGNOSIS — J9621 Acute and chronic respiratory failure with hypoxia: Secondary | ICD-10-CM | POA: Diagnosis not present

## 2018-10-15 NOTE — Progress Notes (Addendum)
Pulmonary Critical Care Medicine [place]   PULMONARY CRITICAL CARE SERVICE  PROGRESS NOTE  Date of Service: 10/15/2018  Pamela Curry  [id number]  DOB: 05-12-1956   DOA: 09/28/2018  Referring Physician: Merton Border, MD  HPI: Pamela Curry is a 63 y.o. female seen for follow up of Acute on Chronic Respiratory Failure.  Patient remains on room air with no acute distress at this time.  Patient remains afebrile.  Medications: Reviewed on Rounds  Physical Exam:  Vitals: Pulse 105 respirations 25 BP 135/59 O2 sat 96% temp 98.0  Ventilator Settings room air  . General: Comfortable at this time . Eyes: Grossly normal lids, irises & conjunctiva . ENT: grossly tongue is normal . Neck: no obvious mass . Cardiovascular: S1 S2 normal no gallop . Respiratory: No rales or rhonchi noted . Abdomen: soft . Skin: no rash seen on limited exam . Musculoskeletal: not rigid . Psychiatric:unable to assess . Neurologic: no seizure no involuntary movements         Lab Data:   Basic Metabolic Panel: Recent Labs  Lab 10/11/18 0829  NA 136  K 3.6  CL 109  CO2 16*  GLUCOSE 115*  BUN 29*  CREATININE 0.86  CALCIUM 8.3*    ABG: No results for input(s): PHART, PCO2ART, PO2ART, HCO3, O2SAT in the last 168 hours.  Liver Function Tests: No results for input(s): AST, ALT, ALKPHOS, BILITOT, PROT, ALBUMIN in the last 168 hours. No results for input(s): LIPASE, AMYLASE in the last 168 hours. No results for input(s): AMMONIA in the last 168 hours.  CBC: Recent Labs  Lab 10/09/18 1108 10/11/18 0829 10/11/18 1723  WBC 15.2* 15.8* 14.3*  HGB 7.6* 6.7* 8.5*  HCT 23.8* 21.6* 26.5*  MCV 96.7 98.6 96.4  PLT 486* 574* 534*    Cardiac Enzymes: No results for input(s): CKTOTAL, CKMB, CKMBINDEX, TROPONINI in the last 168 hours.  BNP (last 3 results) Recent Labs    10/01/18 0600  BNP 593.8*    ProBNP (last 3 results) No results for input(s): PROBNP in the last  8760 hours.  Radiological Exams: No results found.  Assessment/Plan Active Problems:   Acute on chronic respiratory failure with hypoxia (HCC)   Influenza A with pneumonia   Hepatic steatosis   Chronic diastolic heart failure (HCC)   Gastric perforation, acute   Melena   Heme positive stool   Acute blood loss anemia   1. Acute on chronic respiratory failure with hypoxia doing well on room air post decannulation now 48 hours.  Continue secretion management and pulmonary toilet 2. Community-acquired pneumonia continue present therapy 3. Hepatic steatosis history of alcohol abuse continue to monitor 4. Chronic diastolic heart failure at baseline continue supportive care 5. Gastric perforation post surgery continue supportive measures   I have personally seen and evaluated the patient, evaluated laboratory and imaging results, formulated the assessment and plan and placed orders. The Patient requires high complexity decision making for assessment and support.  Case was discussed on Rounds with the Respiratory Therapy Staff  Allyne Gee, MD [facility] Pulmonary Critical Care Medicine Sleep Medicine

## 2018-10-16 DIAGNOSIS — J9621 Acute and chronic respiratory failure with hypoxia: Secondary | ICD-10-CM | POA: Diagnosis not present

## 2018-10-16 DIAGNOSIS — I5032 Chronic diastolic (congestive) heart failure: Secondary | ICD-10-CM | POA: Diagnosis not present

## 2018-10-16 DIAGNOSIS — K3189 Other diseases of stomach and duodenum: Secondary | ICD-10-CM | POA: Diagnosis not present

## 2018-10-16 DIAGNOSIS — K760 Fatty (change of) liver, not elsewhere classified: Secondary | ICD-10-CM | POA: Diagnosis not present

## 2018-10-16 NOTE — Progress Notes (Addendum)
Pulmonary Critical Care Medicine [place]   PULMONARY CRITICAL CARE SERVICE  PROGRESS NOTE  Date of Service: 10/16/2018  Jamayah Myszka  [id number]  DOB: 1955/07/19   DOA: 09/28/2018  Referring Physician: Merton Border, MD  HPI: Pamela Curry is a 63 y.o. female seen for follow up of Acute on Chronic Respiratory Failure.  Patient continues to do well on room air with no distress at this time.  Was decannulated with no issues.  Medications: Reviewed on Rounds  Physical Exam:  Vitals: Pulse 96 respirations 20 BP 143/63 O2 sat 100% temp 97.5  Ventilator Settings room air  . General: Comfortable at this time . Eyes: Grossly normal lids, irises & conjunctiva . ENT: grossly tongue is normal . Neck: no obvious mass . Cardiovascular: S1 S2 normal no gallop . Respiratory: No rales or rhonchi noted . Abdomen: soft . Skin: no rash seen on limited exam . Musculoskeletal: not rigid . Psychiatric:unable to assess . Neurologic: no seizure no involuntary movements         Lab Data:   Basic Metabolic Panel: Recent Labs  Lab 10/11/18 0829  NA 136  K 3.6  CL 109  CO2 16*  GLUCOSE 115*  BUN 29*  CREATININE 0.86  CALCIUM 8.3*    ABG: No results for input(s): PHART, PCO2ART, PO2ART, HCO3, O2SAT in the last 168 hours.  Liver Function Tests: No results for input(s): AST, ALT, ALKPHOS, BILITOT, PROT, ALBUMIN in the last 168 hours. No results for input(s): LIPASE, AMYLASE in the last 168 hours. No results for input(s): AMMONIA in the last 168 hours.  CBC: Recent Labs  Lab 10/11/18 0829 10/11/18 1723  WBC 15.8* 14.3*  HGB 6.7* 8.5*  HCT 21.6* 26.5*  MCV 98.6 96.4  PLT 574* 534*    Cardiac Enzymes: No results for input(s): CKTOTAL, CKMB, CKMBINDEX, TROPONINI in the last 168 hours.  BNP (last 3 results) Recent Labs    10/01/18 0600  BNP 593.8*    ProBNP (last 3 results) No results for input(s): PROBNP in the last 8760 hours.  Radiological  Exams: No results found.  Assessment/Plan Active Problems:   Acute on chronic respiratory failure with hypoxia (HCC)   Influenza A with pneumonia   Hepatic steatosis   Chronic diastolic heart failure (HCC)   Gastric perforation, acute   Melena   Heme positive stool   Acute blood loss anemia   1. Acute on chronic respiratory failure with hypoxia doing well on room air post decannulation now 72 hours.  Continue secretion management pulmonary toilet 2. Community-acquired pneumonia continue present therapy 3. Hepatic steatosis history of alcohol abuse continue to monitor 4. Chronic diastolic heart failure at baseline continue supportive care 5. Gastric perforation post surgery continue supportive measures   I have personally seen and evaluated the patient, evaluated laboratory and imaging results, formulated the assessment and plan and placed orders. The Patient requires high complexity decision making for assessment and support.  Case was discussed on Rounds with the Respiratory Therapy Staff  Allyne Gee, MD [facility] Pulmonary Critical Care Medicine Sleep Medicine

## 2018-10-17 ENCOUNTER — Other Ambulatory Visit (HOSPITAL_COMMUNITY): Payer: Medicare Other

## 2018-10-17 DIAGNOSIS — J9621 Acute and chronic respiratory failure with hypoxia: Secondary | ICD-10-CM | POA: Diagnosis not present

## 2018-10-17 DIAGNOSIS — K760 Fatty (change of) liver, not elsewhere classified: Secondary | ICD-10-CM | POA: Diagnosis not present

## 2018-10-17 DIAGNOSIS — I5032 Chronic diastolic (congestive) heart failure: Secondary | ICD-10-CM | POA: Diagnosis not present

## 2018-10-17 DIAGNOSIS — K3189 Other diseases of stomach and duodenum: Secondary | ICD-10-CM | POA: Diagnosis not present

## 2018-10-17 NOTE — Progress Notes (Addendum)
Pulmonary Critical Care Medicine [place]   PULMONARY CRITICAL CARE SERVICE  PROGRESS NOTE  Date of Service: 10/17/2018  Pamela Curry  [id number]  DOB: 19-Jan-1956   DOA: 09/28/2018  Referring Physician: Merton Border, MD  HPI: Pamela Curry is a 63 y.o. female seen for follow up of Acute on Chronic Respiratory Failure.  Patient had an episode where she was found laying flat in bed and had been vomiting.  Respiratory reports there was vomit at her stoma and she was suctioned through the stoma.  Likely aspiration from this vomitus.  Remains on room air at this time and decannulated currently.  Medications: Reviewed on Rounds  Physical Exam:  Vitals: Pulse 113 respirations 22 BP 115/48 O2 sat 97% temp 98.2  Ventilator Settings room air  . General: Comfortable at this time . Eyes: Grossly normal lids, irises & conjunctiva . ENT: grossly tongue is normal . Neck: no obvious mass . Cardiovascular: S1 S2 normal no gallop . Respiratory: No rales or rhonchi noted . Abdomen: soft . Skin: no rash seen on limited exam . Musculoskeletal: not rigid . Psychiatric:unable to assess . Neurologic: no seizure no involuntary movements         Lab Data:   Basic Metabolic Panel: Recent Labs  Lab 10/11/18 0829  NA 136  K 3.6  CL 109  CO2 16*  GLUCOSE 115*  BUN 29*  CREATININE 0.86  CALCIUM 8.3*    ABG: No results for input(s): PHART, PCO2ART, PO2ART, HCO3, O2SAT in the last 168 hours.  Liver Function Tests: No results for input(s): AST, ALT, ALKPHOS, BILITOT, PROT, ALBUMIN in the last 168 hours. No results for input(s): LIPASE, AMYLASE in the last 168 hours. No results for input(s): AMMONIA in the last 168 hours.  CBC: Recent Labs  Lab 10/11/18 0829 10/11/18 1723  WBC 15.8* 14.3*  HGB 6.7* 8.5*  HCT 21.6* 26.5*  MCV 98.6 96.4  PLT 574* 534*    Cardiac Enzymes: No results for input(s): CKTOTAL, CKMB, CKMBINDEX, TROPONINI in the last 168  hours.  BNP (last 3 results) Recent Labs    10/01/18 0600  BNP 593.8*    ProBNP (last 3 results) No results for input(s): PROBNP in the last 8760 hours.  Radiological Exams: Dg Chest Port 1 View  Result Date: 10/17/2018 CLINICAL DATA:  Aspiration into airway. EXAM: PORTABLE CHEST 1 VIEW COMPARISON:  09/28/2018. FINDINGS: The radiograph is rotated, and telemetry leads overlie the lower RIGHT chest. The heart is enlarged. Calcified tortuous aorta. Central line and tracheostomy have been removed. Elevated RIGHT hemidiaphragm. No definite consolidation or edema. IMPRESSION: Cardiomegaly. No definite consolidation or edema. Electronically Signed   By: Staci Righter M.D.   On: 10/17/2018 13:23    Assessment/Plan Active Problems:   Acute on chronic respiratory failure with hypoxia (HCC)   Influenza A with pneumonia   Hepatic steatosis   Chronic diastolic heart failure (HCC)   Gastric perforation, acute   Melena   Heme positive stool   Acute blood loss anemia   1. Acute on chronic respiratory failure with hypoxia patient has been 96 hours post decannulation however had a vomiting event that is most likely caused some aspiration.  We will continue secretion management and pulmonary toilet 2. Community-acquired pneumonia therapy will likely need to be extended due to aspiration. 3. Hepatic steatosis history of alcohol abuse continue to monitor 4. Chronic diastolic heart failure at baseline continue supportive care 5. Gastric perforation post surgery continue supportive measures  I have personally seen and evaluated the patient, evaluated laboratory and imaging results, formulated the assessment and plan and placed orders. The Patient requires high complexity decision making for assessment and support.  Case was discussed on Rounds with the Respiratory Therapy Staff  Allyne Gee, MD [facility] Pulmonary Critical Care Medicine Sleep Medicine

## 2018-10-18 ENCOUNTER — Other Ambulatory Visit (HOSPITAL_COMMUNITY): Payer: Medicare Other

## 2018-10-18 DIAGNOSIS — J9621 Acute and chronic respiratory failure with hypoxia: Secondary | ICD-10-CM | POA: Diagnosis not present

## 2018-10-18 DIAGNOSIS — K760 Fatty (change of) liver, not elsewhere classified: Secondary | ICD-10-CM | POA: Diagnosis not present

## 2018-10-18 DIAGNOSIS — K3189 Other diseases of stomach and duodenum: Secondary | ICD-10-CM | POA: Diagnosis not present

## 2018-10-18 DIAGNOSIS — I5032 Chronic diastolic (congestive) heart failure: Secondary | ICD-10-CM | POA: Diagnosis not present

## 2018-10-18 LAB — CBC
HCT: 15.6 % — ABNORMAL LOW (ref 36.0–46.0)
Hemoglobin: 4.6 g/dL — CL (ref 12.0–15.0)
MCH: 33.8 pg (ref 26.0–34.0)
MCHC: 29.5 g/dL — ABNORMAL LOW (ref 30.0–36.0)
MCV: 114.7 fL — ABNORMAL HIGH (ref 80.0–100.0)
Platelets: 643 10*3/uL — ABNORMAL HIGH (ref 150–400)
RBC: 1.36 MIL/uL — ABNORMAL LOW (ref 3.87–5.11)
RDW: 25.2 % — ABNORMAL HIGH (ref 11.5–15.5)
WBC: 55.8 10*3/uL (ref 4.0–10.5)
nRBC: 3.5 % — ABNORMAL HIGH (ref 0.0–0.2)

## 2018-10-18 LAB — BASIC METABOLIC PANEL
Anion gap: 9 (ref 5–15)
BUN: 95 mg/dL — ABNORMAL HIGH (ref 8–23)
CO2: 10 mmol/L — ABNORMAL LOW (ref 22–32)
Calcium: 9.3 mg/dL (ref 8.9–10.3)
Chloride: 118 mmol/L — ABNORMAL HIGH (ref 98–111)
Creatinine, Ser: 2.12 mg/dL — ABNORMAL HIGH (ref 0.44–1.00)
GFR calc Af Amer: 28 mL/min — ABNORMAL LOW (ref >60)
GFR calc non Af Amer: 24 mL/min — ABNORMAL LOW (ref >60)
Glucose, Bld: 141 mg/dL — ABNORMAL HIGH (ref 70–99)
Potassium: 5 mmol/L (ref 3.5–5.1)
Sodium: 137 mmol/L (ref 135–145)

## 2018-10-18 LAB — PREPARE RBC (CROSSMATCH)

## 2018-10-18 NOTE — Progress Notes (Addendum)
Pulmonary Critical Care Medicine [place]   PULMONARY CRITICAL CARE SERVICE  PROGRESS NOTE  Date of Service: 10/18/2018  Pamela Curry  [id number]  DOB: 11-Dec-1955   DOA: 09/28/2018  Referring Physician: Merton Border, MD  HPI: Pamela Curry is a 63 y.o. female seen for follow up of Acute on Chronic Respiratory Failure.  Patient remains on aerosol trach collar 28% FiO2.  Patient has an increased white count and a low hemoglobin.  Sputum culture has been obtained and sent.  Patient is trach was replaced.  Medications: Reviewed on Rounds  Physical Exam:  Vitals: Pulse 113 respirations 22 BP 118/58 O2 sat 98% temp 99.1  Ventilator Settings ATC 28%  . General: Comfortable at this time . Eyes: Grossly normal lids, irises & conjunctiva . ENT: grossly tongue is normal . Neck: no obvious mass . Cardiovascular: S1 S2 normal no gallop . Respiratory: No rales or rhonchi noted . Abdomen: soft . Skin: no rash seen on limited exam . Musculoskeletal: not rigid . Psychiatric:unable to assess . Neurologic: no seizure no involuntary movements         Lab Data:   Basic Metabolic Panel: Recent Labs  Lab 10/18/18 1225  NA 137  K 5.0  CL 118*  CO2 10*  GLUCOSE 141*  BUN 95*  CREATININE 2.12*  CALCIUM 9.3    ABG: No results for input(s): PHART, PCO2ART, PO2ART, HCO3, O2SAT in the last 168 hours.  Liver Function Tests: No results for input(s): AST, ALT, ALKPHOS, BILITOT, PROT, ALBUMIN in the last 168 hours. No results for input(s): LIPASE, AMYLASE in the last 168 hours. No results for input(s): AMMONIA in the last 168 hours.  CBC: Recent Labs  Lab 10/11/18 1723 10/18/18 1225  WBC 14.3* 55.8*  HGB 8.5* 4.6*  HCT 26.5* 15.6*  MCV 96.4 114.7*  PLT 534* 643*    Cardiac Enzymes: No results for input(s): CKTOTAL, CKMB, CKMBINDEX, TROPONINI in the last 168 hours.  BNP (last 3 results) Recent Labs    10/01/18 0600  BNP 593.8*    ProBNP (last 3  results) No results for input(s): PROBNP in the last 8760 hours.  Radiological Exams: Dg Chest Port 1 View  Result Date: 10/18/2018 CLINICAL DATA:  Tracheostomy tube insertion.  Possible aspiration. EXAM: PORTABLE CHEST 1 VIEW COMPARISON:  10/17/2018 FINDINGS: Tracheostomy tube is seen appropriate position. New mild opacity seen in both lung bases, suspicious for pneumonia. No evidence pleural effusion. Heart size is within normal limits. IMPRESSION: New mild bibasilar pulmonary opacities, suspicious for pneumonia. Electronically Signed   By: Earle Gell M.D.   On: 10/18/2018 15:37   Dg Chest Port 1 View  Result Date: 10/17/2018 CLINICAL DATA:  Aspiration into airway. EXAM: PORTABLE CHEST 1 VIEW COMPARISON:  09/28/2018. FINDINGS: The radiograph is rotated, and telemetry leads overlie the lower RIGHT chest. The heart is enlarged. Calcified tortuous aorta. Central line and tracheostomy have been removed. Elevated RIGHT hemidiaphragm. No definite consolidation or edema. IMPRESSION: Cardiomegaly. No definite consolidation or edema. Electronically Signed   By: Staci Righter M.D.   On: 10/17/2018 13:23    Assessment/Plan Active Problems:   Acute on chronic respiratory failure with hypoxia (HCC)   Influenza A with pneumonia   Hepatic steatosis   Chronic diastolic heart failure (HCC)   Gastric perforation, acute   Melena   Heme positive stool   Acute blood loss anemia   1. Acute on chronic respiratory failure with hypoxia patient had to have trach replaced last night  due to decreasing respiratory status.  Awaiting sputum culture as patient has increased white blood cell count with low-grade fever. 2. Community-acquired pneumonia continue present therapy 3. Hepatic steatosis history of alcohol abuse continue to monitor 4. Chronic diastolic heart failure at baseline continue supportive care 5. Gastric perforation post surgery continue supportive measures   I have personally seen and evaluated  the patient, evaluated laboratory and imaging results, formulated the assessment and plan and placed orders. The Patient requires high complexity decision making for assessment and support.  Case was discussed on Rounds with the Respiratory Therapy Staff  Allyne Gee, MD [facility] Pulmonary Critical Care Medicine Sleep Medicine

## 2018-10-19 DIAGNOSIS — K760 Fatty (change of) liver, not elsewhere classified: Secondary | ICD-10-CM | POA: Diagnosis not present

## 2018-10-19 DIAGNOSIS — K3189 Other diseases of stomach and duodenum: Secondary | ICD-10-CM | POA: Diagnosis not present

## 2018-10-19 DIAGNOSIS — I5032 Chronic diastolic (congestive) heart failure: Secondary | ICD-10-CM | POA: Diagnosis not present

## 2018-10-19 DIAGNOSIS — J9621 Acute and chronic respiratory failure with hypoxia: Secondary | ICD-10-CM | POA: Diagnosis not present

## 2018-10-19 LAB — CBC
HCT: 22.2 % — ABNORMAL LOW (ref 36.0–46.0)
HCT: 30.3 % — ABNORMAL LOW (ref 36.0–46.0)
HCT: 26.9 % — ABNORMAL LOW (ref 36.0–46.0)
Hemoglobin: 6.9 g/dL — CL (ref 12.0–15.0)
Hemoglobin: 9.4 g/dL — ABNORMAL LOW (ref 12.0–15.0)
Hemoglobin: 8.4 g/dL — ABNORMAL LOW (ref 12.0–15.0)
MCH: 31.9 pg (ref 26.0–34.0)
MCH: 31.2 pg (ref 26.0–34.0)
MCH: 31.8 pg (ref 26.0–34.0)
MCHC: 31.1 g/dL (ref 30.0–36.0)
MCHC: 31 g/dL (ref 30.0–36.0)
MCHC: 31.2 g/dL (ref 30.0–36.0)
MCV: 102.8 fL — ABNORMAL HIGH (ref 80.0–100.0)
MCV: 100.7 fL — ABNORMAL HIGH (ref 80.0–100.0)
MCV: 101.9 fL — ABNORMAL HIGH (ref 80.0–100.0)
Platelets: 464 10*3/uL — ABNORMAL HIGH (ref 150–400)
Platelets: 547 10*3/uL — ABNORMAL HIGH (ref 150–400)
Platelets: 487 10*3/uL — ABNORMAL HIGH (ref 150–400)
RBC: 2.16 MIL/uL — ABNORMAL LOW (ref 3.87–5.11)
RBC: 3.01 MIL/uL — ABNORMAL LOW (ref 3.87–5.11)
RBC: 2.64 MIL/uL — ABNORMAL LOW (ref 3.87–5.11)
RDW: 23.3 % — ABNORMAL HIGH (ref 11.5–15.5)
RDW: 22.5 % — ABNORMAL HIGH (ref 11.5–15.5)
RDW: 23.5 % — ABNORMAL HIGH (ref 11.5–15.5)
WBC: 42 10*3/uL — ABNORMAL HIGH (ref 4.0–10.5)
WBC: 51.1 10*3/uL (ref 4.0–10.5)
WBC: 50.3 10*3/uL (ref 4.0–10.5)
nRBC: 4 % — ABNORMAL HIGH (ref 0.0–0.2)
nRBC: 7.1 % — ABNORMAL HIGH (ref 0.0–0.2)
nRBC: 7.1 % — ABNORMAL HIGH (ref 0.0–0.2)

## 2018-10-19 LAB — BASIC METABOLIC PANEL
Anion gap: 10 (ref 5–15)
BUN: 101 mg/dL — ABNORMAL HIGH (ref 8–23)
CO2: 11 mmol/L — ABNORMAL LOW (ref 22–32)
Calcium: 9.5 mg/dL (ref 8.9–10.3)
Chloride: 123 mmol/L — ABNORMAL HIGH (ref 98–111)
Creatinine, Ser: 1.96 mg/dL — ABNORMAL HIGH (ref 0.44–1.00)
GFR calc Af Amer: 31 mL/min — ABNORMAL LOW (ref >60)
GFR calc non Af Amer: 27 mL/min — ABNORMAL LOW (ref >60)
Glucose, Bld: 127 mg/dL — ABNORMAL HIGH (ref 70–99)
Potassium: 5.1 mmol/L (ref 3.5–5.1)
Sodium: 144 mmol/L (ref 135–145)

## 2018-10-19 LAB — BLOOD GAS, ARTERIAL
Acid-base deficit: 18.3 mmol/L — ABNORMAL HIGH (ref 0.0–2.0)
Acid-base deficit: 16.7 mmol/L — ABNORMAL HIGH (ref 0.0–2.0)
Bicarbonate: 8.4 mmol/L — ABNORMAL LOW (ref 20.0–28.0)
Bicarbonate: 12.2 mmol/L — ABNORMAL LOW (ref 20.0–28.0)
Drawn by: 284881
FIO2: 0.8
FIO2: 1
MECHVT: 380 mL
O2 Saturation: 93.3 %
O2 Saturation: 96.9 %
PEEP: 5 cmH2O
Patient temperature: 98.6
Patient temperature: 98.6
RATE: 16 resp/min
pCO2 arterial: 23 mmHg — ABNORMAL LOW (ref 32.0–48.0)
pCO2 arterial: 49.5 mmHg — ABNORMAL HIGH (ref 32.0–48.0)
pH, Arterial: 7.189 — CL (ref 7.350–7.450)
pH, Arterial: 7.02 — CL (ref 7.350–7.450)
pO2, Arterial: 72.5 mmHg — ABNORMAL LOW (ref 83.0–108.0)
pO2, Arterial: 112 mmHg — ABNORMAL HIGH (ref 83.0–108.0)

## 2018-10-19 LAB — PATHOLOGIST SMEAR REVIEW

## 2018-10-19 LAB — LACTIC ACID, PLASMA: Lactic Acid, Venous: 1.1 mmol/L (ref 0.5–1.9)

## 2018-10-19 LAB — PREPARE RBC (CROSSMATCH)

## 2018-10-19 NOTE — Progress Notes (Addendum)
Pulmonary Critical Care Medicine [place]   PULMONARY CRITICAL CARE SERVICE  PROGRESS NOTE  Date of Service: 10/19/2018  Pamela Curry  KXF:[id number]  DOB: March 25, 1956   DOA: 09/28/2018  Referring Physician: Merton Border, MD  HPI: Pamela Curry is a 63 y.o. female seen for follow up of Acute on Chronic Respiratory Failure.  Patient mains on aerosol trach collar currently 35% FiO2.  Continues to have low hemoglobin but getting more blood today.  Currently resting with no fever or distress.  Medications: Reviewed on Rounds  Physical Exam:  Vitals: Pulse 110 respirations 37 BP 107/51 O2 sat 98% temp 97.8  Ventilator Settings ATC 35%  . General: Comfortable at this time . Eyes: Grossly normal lids, irises & conjunctiva . ENT: grossly tongue is normal . Neck: no obvious mass . Cardiovascular: S1 S2 normal no gallop . Respiratory: No rales or rhonchi noted . Abdomen: soft . Skin: no rash seen on limited exam . Musculoskeletal: not rigid . Psychiatric:unable to assess . Neurologic: no seizure no involuntary movements         Lab Data:   Basic Metabolic Panel: Recent Labs  Lab 10/18/18 1225  NA 137  K 5.0  CL 118*  CO2 10*  GLUCOSE 141*  BUN 95*  CREATININE 2.12*  CALCIUM 9.3    ABG: Recent Labs  Lab 10/19/18 1740  PHART 7.189*  PCO2ART 23.0*  PO2ART 72.5*  HCO3 8.4*  O2SAT 93.3    Liver Function Tests: No results for input(s): AST, ALT, ALKPHOS, BILITOT, PROT, ALBUMIN in the last 168 hours. No results for input(s): LIPASE, AMYLASE in the last 168 hours. No results for input(s): AMMONIA in the last 168 hours.  CBC: Recent Labs  Lab 10/18/18 1225 10/19/18 0528  WBC 55.8* 42.0*  HGB 4.6* 6.9*  HCT 15.6* 22.2*  MCV 114.7* 102.8*  PLT 643* 464*    Cardiac Enzymes: No results for input(s): CKTOTAL, CKMB, CKMBINDEX, TROPONINI in the last 168 hours.  BNP (last 3 results) Recent Labs    10/01/18 0600  BNP 593.8*    ProBNP  (last 3 results) No results for input(s): PROBNP in the last 8760 hours.  Radiological Exams: Dg Chest Port 1 View  Result Date: 10/18/2018 CLINICAL DATA:  Tracheostomy tube insertion.  Possible aspiration. EXAM: PORTABLE CHEST 1 VIEW COMPARISON:  10/17/2018 FINDINGS: Tracheostomy tube is seen appropriate position. New mild opacity seen in both lung bases, suspicious for pneumonia. No evidence pleural effusion. Heart size is within normal limits. IMPRESSION: New mild bibasilar pulmonary opacities, suspicious for pneumonia. Electronically Signed   By: Earle Gell M.D.   On: 10/18/2018 15:37    Assessment/Plan Active Problems:   Acute on chronic respiratory failure with hypoxia (HCC)   Influenza A with pneumonia   Hepatic steatosis   Chronic diastolic heart failure (HCC)   Gastric perforation, acute   Melena   Heme positive stool   Acute blood loss anemia   1. Acute on chronic respiratory failure with hypoxia patient continues to be trached with aerosol trach collar 5% FiO2.  Awaiting sputum results. 2. Community-acquired pneumonia continue rest therapy 3. Hepatic steatosis history of alcohol use continue to monitor 4. Chronic diastolic heart failure at baseline continue supportive care 5. Gastric perforation post surgery continue supportive measures   I have personally seen and evaluated the patient, evaluated laboratory and imaging results, formulated the assessment and plan and placed orders. The Patient requires high complexity decision making for assessment and support.  Case  was discussed on Rounds with the Respiratory Therapy Staff  Allyne Gee, MD [facility] Pulmonary Critical Care Medicine Sleep Medicine

## 2018-10-20 ENCOUNTER — Other Ambulatory Visit (HOSPITAL_COMMUNITY): Payer: Medicare Other

## 2018-10-20 DIAGNOSIS — J9621 Acute and chronic respiratory failure with hypoxia: Secondary | ICD-10-CM | POA: Diagnosis not present

## 2018-10-20 DIAGNOSIS — I5032 Chronic diastolic (congestive) heart failure: Secondary | ICD-10-CM | POA: Diagnosis not present

## 2018-10-20 DIAGNOSIS — K760 Fatty (change of) liver, not elsewhere classified: Secondary | ICD-10-CM | POA: Diagnosis not present

## 2018-10-20 DIAGNOSIS — K3189 Other diseases of stomach and duodenum: Secondary | ICD-10-CM | POA: Diagnosis not present

## 2018-10-20 LAB — BASIC METABOLIC PANEL
Anion gap: 11 (ref 5–15)
Anion gap: 10 (ref 5–15)
BUN: 107 mg/dL — ABNORMAL HIGH (ref 8–23)
BUN: 109 mg/dL — ABNORMAL HIGH (ref 8–23)
CO2: 13 mmol/L — ABNORMAL LOW (ref 22–32)
CO2: 16 mmol/L — ABNORMAL LOW (ref 22–32)
Calcium: 9.1 mg/dL (ref 8.9–10.3)
Calcium: 8.1 mg/dL — ABNORMAL LOW (ref 8.9–10.3)
Chloride: 123 mmol/L — ABNORMAL HIGH (ref 98–111)
Chloride: 116 mmol/L — ABNORMAL HIGH (ref 98–111)
Creatinine, Ser: 2.15 mg/dL — ABNORMAL HIGH (ref 0.44–1.00)
Creatinine, Ser: 2.48 mg/dL — ABNORMAL HIGH (ref 0.44–1.00)
GFR calc Af Amer: 28 mL/min — ABNORMAL LOW (ref >60)
GFR calc Af Amer: 23 mL/min — ABNORMAL LOW (ref >60)
GFR calc non Af Amer: 24 mL/min — ABNORMAL LOW (ref >60)
GFR calc non Af Amer: 20 mL/min — ABNORMAL LOW (ref >60)
Glucose, Bld: 140 mg/dL — ABNORMAL HIGH (ref 70–99)
Glucose, Bld: 121 mg/dL — ABNORMAL HIGH (ref 70–99)
Potassium: 5.7 mmol/L — ABNORMAL HIGH (ref 3.5–5.1)
Potassium: 4.4 mmol/L (ref 3.5–5.1)
Sodium: 147 mmol/L — ABNORMAL HIGH (ref 135–145)
Sodium: 142 mmol/L (ref 135–145)

## 2018-10-20 LAB — CULTURE, RESPIRATORY W GRAM STAIN

## 2018-10-20 LAB — COMPREHENSIVE METABOLIC PANEL
ALT: 11 U/L (ref 0–44)
AST: 32 U/L (ref 15–41)
Albumin: 1.7 g/dL — ABNORMAL LOW (ref 3.5–5.0)
Alkaline Phosphatase: 193 U/L — ABNORMAL HIGH (ref 38–126)
Anion gap: 10 (ref 5–15)
BUN: 111 mg/dL — ABNORMAL HIGH (ref 8–23)
CO2: 15 mmol/L — ABNORMAL LOW (ref 22–32)
Calcium: 8.3 mg/dL — ABNORMAL LOW (ref 8.9–10.3)
Chloride: 119 mmol/L — ABNORMAL HIGH (ref 98–111)
Creatinine, Ser: 2.38 mg/dL — ABNORMAL HIGH (ref 0.44–1.00)
GFR calc Af Amer: 24 mL/min — ABNORMAL LOW (ref >60)
GFR calc non Af Amer: 21 mL/min — ABNORMAL LOW (ref >60)
Glucose, Bld: 138 mg/dL — ABNORMAL HIGH (ref 70–99)
Potassium: 4.6 mmol/L (ref 3.5–5.1)
Sodium: 144 mmol/L (ref 135–145)
Total Bilirubin: 0.6 mg/dL (ref 0.3–1.2)
Total Protein: 5.6 g/dL — ABNORMAL LOW (ref 6.5–8.1)

## 2018-10-20 LAB — BLOOD GAS, ARTERIAL
Acid-base deficit: 12.9 mmol/L — ABNORMAL HIGH (ref 0.0–2.0)
Acid-base deficit: 15.5 mmol/L — ABNORMAL HIGH (ref 0.0–2.0)
Acid-base deficit: 15.8 mmol/L — ABNORMAL HIGH (ref 0.0–2.0)
Bicarbonate: 12.6 mmol/L — ABNORMAL LOW (ref 20.0–28.0)
Bicarbonate: 13.8 mmol/L — ABNORMAL LOW (ref 20.0–28.0)
Bicarbonate: 14.6 mmol/L — ABNORMAL LOW (ref 20.0–28.0)
FIO2: 0.5
FIO2: 50
FIO2: 50
MECHVT: 380 mL
MECHVT: 450 mL
MECHVT: 450 mL
O2 Saturation: 96 %
O2 Saturation: 97.5 %
O2 Saturation: 97.8 %
PEEP: 5 cmH2O
PEEP: 5 cmH2O
Patient temperature: 98.6
Patient temperature: 98.6
Patient temperature: 98.6
RATE: 20 resp/min
RATE: 24 resp/min
RATE: 24 resp/min
pCO2 arterial: 45.9 mmHg (ref 32.0–48.0)
pCO2 arterial: 47.1 mmHg (ref 32.0–48.0)
pCO2 arterial: 58.5 mmHg — ABNORMAL HIGH (ref 32.0–48.0)
pH, Arterial: 7.002 — CL (ref 7.350–7.450)
pH, Arterial: 7.056 — CL (ref 7.350–7.450)
pH, Arterial: 7.129 — CL (ref 7.350–7.450)
pO2, Arterial: 104 mmHg (ref 83.0–108.0)
pO2, Arterial: 107 mmHg (ref 83.0–108.0)
pO2, Arterial: 97.7 mmHg (ref 83.0–108.0)

## 2018-10-20 LAB — TYPE AND SCREEN
ABO/RH(D): A POS
Antibody Screen: NEGATIVE
Unit division: 0
Unit division: 0
Unit division: 0

## 2018-10-20 LAB — MAGNESIUM: Magnesium: 2.5 mg/dL — ABNORMAL HIGH (ref 1.7–2.4)

## 2018-10-20 LAB — CBC WITH DIFFERENTIAL/PLATELET
Abs Immature Granulocytes: 0 10*3/uL (ref 0.00–0.07)
Basophils Absolute: 0 10*3/uL (ref 0.0–0.1)
Basophils Relative: 0 %
Eosinophils Absolute: 0 10*3/uL (ref 0.0–0.5)
Eosinophils Relative: 0 %
HCT: 23.2 % — ABNORMAL LOW (ref 36.0–46.0)
Hemoglobin: 7.4 g/dL — ABNORMAL LOW (ref 12.0–15.0)
Lymphocytes Relative: 6 %
Lymphs Abs: 2.1 10*3/uL (ref 0.7–4.0)
MCH: 31.9 pg (ref 26.0–34.0)
MCHC: 31.9 g/dL (ref 30.0–36.0)
MCV: 100 fL (ref 80.0–100.0)
Monocytes Absolute: 1.4 10*3/uL — ABNORMAL HIGH (ref 0.1–1.0)
Monocytes Relative: 4 %
Neutro Abs: 31.4 10*3/uL — ABNORMAL HIGH (ref 1.7–7.7)
Neutrophils Relative %: 90 %
Platelets: 374 10*3/uL (ref 150–400)
RBC: 2.32 MIL/uL — ABNORMAL LOW (ref 3.87–5.11)
RDW: 23.1 % — ABNORMAL HIGH (ref 11.5–15.5)
WBC: 34.9 10*3/uL — ABNORMAL HIGH (ref 4.0–10.5)
nRBC: 3 /100 WBC — ABNORMAL HIGH
nRBC: 4.6 % — ABNORMAL HIGH (ref 0.0–0.2)

## 2018-10-20 LAB — BPAM RBC
Blood Product Expiration Date: 202005032359
Blood Product Expiration Date: 202005042359
Blood Product Expiration Date: 202005042359
ISSUE DATE / TIME: 202004271546
ISSUE DATE / TIME: 202004272126
ISSUE DATE / TIME: 202004281409
Unit Type and Rh: 600
Unit Type and Rh: 600
Unit Type and Rh: 600

## 2018-10-20 LAB — CBC
HCT: 28.9 % — ABNORMAL LOW (ref 36.0–46.0)
Hemoglobin: 8.8 g/dL — ABNORMAL LOW (ref 12.0–15.0)
MCH: 31 pg (ref 26.0–34.0)
MCHC: 30.4 g/dL (ref 30.0–36.0)
MCV: 101.8 fL — ABNORMAL HIGH (ref 80.0–100.0)
Platelets: 439 10*3/uL — ABNORMAL HIGH (ref 150–400)
RBC: 2.84 MIL/uL — ABNORMAL LOW (ref 3.87–5.11)
RDW: 23.7 % — ABNORMAL HIGH (ref 11.5–15.5)
WBC: 45.5 10*3/uL — ABNORMAL HIGH (ref 4.0–10.5)
nRBC: 8 % — ABNORMAL HIGH (ref 0.0–0.2)

## 2018-10-20 LAB — VANCOMYCIN, TROUGH: Vancomycin Tr: 42 ug/mL (ref 15–20)

## 2018-10-20 LAB — LACTIC ACID, PLASMA
Lactic Acid, Venous: 0.9 mmol/L (ref 0.5–1.9)
Lactic Acid, Venous: 1.1 mmol/L (ref 0.5–1.9)

## 2018-10-20 LAB — CULTURE, RESPIRATORY: Culture: NORMAL

## 2018-10-20 NOTE — Progress Notes (Signed)
Pulmonary Critical Care Medicine [place]   PULMONARY CRITICAL CARE SERVICE  PROGRESS NOTE  Date of Service: 10/20/2018  Tyese Finken  LOV:[id number]  DOB: Jul 15, 1955   DOA: 09/28/2018  Referring Physician: Merton Border, MD  HPI: Pamela Curry is a 63 y.o. female seen for follow up of Acute on Chronic Respiratory Failure.  Patient is currently on full support on assist control mode Patient is not been able to tolerate any weaning today.  Medications: Reviewed on Rounds  Physical Exam:  Vitals: Temperature 96.8 pulse 104 respiratory 25 blood pressure 106/68 saturations 100%  Ventilator Settings mode ventilation assist control FiO2 40% tidal volume 517 PEEP 5  . General: Comfortable at this time . Eyes: Grossly normal lids, irises & conjunctiva . ENT: grossly tongue is normal . Neck: no obvious mass . Cardiovascular: S1 S2 normal no gallop . Respiratory: No rhonchi or rales are noted at this time . Abdomen: soft . Skin: no rash seen on limited exam . Musculoskeletal: not rigid . Psychiatric:unable to assess . Neurologic: no seizure no involuntary movements         Lab Data:   Basic Metabolic Panel: Recent Labs  Lab 10/18/18 1225 10/19/18 1859 10/20/18 0147 10/20/18 1244  NA 137 144 147* 144  K 5.0 5.1 5.7* 4.6  CL 118* 123* 123* 119*  CO2 10* 11* 13* 15*  GLUCOSE 141* 127* 140* 138*  BUN 95* 101* 107* 111*  CREATININE 2.12* 1.96* 2.15* 2.38*  CALCIUM 9.3 9.5 9.1 8.3*  MG  --   --  2.5*  --     ABG: Recent Labs  Lab 10/19/18 2054 10/20/18 0005 10/20/18 0429 10/20/18 0750 10/20/18 1415  PHART 7.020* 7.002* 7.056* 7.129* 7.146*  PCO2ART 49.5* 58.5* 47.1 45.9 42.6  PO2ART 112* 97.7 104 107 84.4  HCO3 12.2* 13.8* 12.6* 14.6* 14.1*  O2SAT 96.9 96.0 97.5 97.8 96.4    Liver Function Tests: Recent Labs  Lab 10/20/18 1244  AST 32  ALT 11  ALKPHOS 193*  BILITOT 0.6  PROT 5.6*  ALBUMIN 1.7*   No results for input(s): LIPASE,  AMYLASE in the last 168 hours. No results for input(s): AMMONIA in the last 168 hours.  CBC: Recent Labs  Lab 10/19/18 0528 10/19/18 1859 10/19/18 2316 10/20/18 0514 10/20/18 1244  WBC 42.0* 51.1* 50.3* 45.5* 34.9*  NEUTROABS  --   --   --   --  31.4*  HGB 6.9* 9.4* 8.4* 8.8* 7.4*  HCT 22.2* 30.3* 26.9* 28.9* 23.2*  MCV 102.8* 100.7* 101.9* 101.8* 100.0  PLT 464* 547* 487* 439* 374    Cardiac Enzymes: No results for input(s): CKTOTAL, CKMB, CKMBINDEX, TROPONINI in the last 168 hours.  BNP (last 3 results) Recent Labs    10/01/18 0600  BNP 593.8*    ProBNP (last 3 results) No results for input(s): PROBNP in the last 8760 hours.  Radiological Exams: Dg Chest Port 1 View  Result Date: 10/20/2018 CLINICAL DATA:  Respiratory failure EXAM: PORTABLE CHEST 1 VIEW COMPARISON:  October 18, 2018 FINDINGS: Tracheostomy catheter tip is 1.6 cm above the carina. No pneumothorax. There is extensive airspace opacity bilaterally, new from 2 days prior. Heart is upper normal in size with pulmonary vascularity normal. No adenopathy. No bone lesions. IMPRESSION: Tracheostomy as described without pneumothorax. Extensive airspace opacity bilaterally. Suspect multifocal pneumonia. A degree of pulmonary edema superimposed is a differential consideration as is ARDS. Stable cardiac silhouette. Electronically Signed   By: Lowella Grip III M.D.  On: 10/20/2018 08:07    Assessment/Plan Active Problems:   Acute on chronic respiratory failure with hypoxia (HCC)   Influenza A with pneumonia   Hepatic steatosis   Chronic diastolic heart failure (HCC)   Gastric perforation, acute   Melena   Heme positive stool   Acute blood loss anemia   1. Acute on chronic respiratory failure hypoxia patient continues on the ventilator was placed overnight.  Right now on assist control mode 40% FiO2.  Titrate oxygen down as tolerated. 2. Influenza A pneumonia treated we will continue with supportive care  following up on the chest x-ray showed fairly extensive airspace disease consistent with some multifocal pneumonia.  With also be partially consistent with some edema with diuresis as tolerated. 3. Chronic diastolic heart failure at baseline we will continue with present management diuretics as necessary for the findings of the chest x-ray may help to improve oxygenation also 4. Gastric perforation status post surgery   I have personally seen and evaluated the patient, evaluated laboratory and imaging results, formulated the assessment and plan and placed orders. The Patient requires high complexity decision making for assessment and support.  Case was discussed on Rounds with the Respiratory Therapy Staff  Allyne Gee, MD [facility] Pulmonary Critical Care Medicine Sleep Medicine

## 2018-10-21 DIAGNOSIS — J9621 Acute and chronic respiratory failure with hypoxia: Secondary | ICD-10-CM | POA: Diagnosis not present

## 2018-10-21 DIAGNOSIS — K760 Fatty (change of) liver, not elsewhere classified: Secondary | ICD-10-CM | POA: Diagnosis not present

## 2018-10-21 DIAGNOSIS — K3189 Other diseases of stomach and duodenum: Secondary | ICD-10-CM | POA: Diagnosis not present

## 2018-10-21 DIAGNOSIS — I5032 Chronic diastolic (congestive) heart failure: Secondary | ICD-10-CM | POA: Diagnosis not present

## 2018-10-21 LAB — BASIC METABOLIC PANEL
Anion gap: 13 (ref 5–15)
BUN: 112 mg/dL — ABNORMAL HIGH (ref 8–23)
CO2: 14 mmol/L — ABNORMAL LOW (ref 22–32)
Calcium: 8 mg/dL — ABNORMAL LOW (ref 8.9–10.3)
Chloride: 112 mmol/L — ABNORMAL HIGH (ref 98–111)
Creatinine, Ser: 2.68 mg/dL — ABNORMAL HIGH (ref 0.44–1.00)
GFR calc Af Amer: 21 mL/min — ABNORMAL LOW (ref >60)
GFR calc non Af Amer: 18 mL/min — ABNORMAL LOW (ref >60)
Glucose, Bld: 104 mg/dL — ABNORMAL HIGH (ref 70–99)
Potassium: 4.6 mmol/L (ref 3.5–5.1)
Sodium: 139 mmol/L (ref 135–145)

## 2018-10-21 LAB — CBC
HCT: 24 % — ABNORMAL LOW (ref 36.0–46.0)
Hemoglobin: 7.4 g/dL — ABNORMAL LOW (ref 12.0–15.0)
MCH: 31.1 pg (ref 26.0–34.0)
MCHC: 30.8 g/dL (ref 30.0–36.0)
MCV: 100.8 fL — ABNORMAL HIGH (ref 80.0–100.0)
Platelets: 303 10*3/uL (ref 150–400)
RBC: 2.38 MIL/uL — ABNORMAL LOW (ref 3.87–5.11)
RDW: 21.2 % — ABNORMAL HIGH (ref 11.5–15.5)
WBC: 34.9 10*3/uL — ABNORMAL HIGH (ref 4.0–10.5)
nRBC: 2 % — ABNORMAL HIGH (ref 0.0–0.2)

## 2018-10-21 LAB — PROTIME-INR
INR: 1.5 — ABNORMAL HIGH (ref 0.8–1.2)
Prothrombin Time: 17.8 seconds — ABNORMAL HIGH (ref 11.4–15.2)

## 2018-10-21 NOTE — Progress Notes (Addendum)
Pulmonary Critical Care Medicine [place]   PULMONARY CRITICAL CARE SERVICE  PROGRESS NOTE  Date of Service: 10/21/2018  Pamela Curry  YNW:[id number]  DOB: 1955-07-25   DOA: 09/28/2018  Referring Physician: Merton Border, MD  HPI: Pamela Curry is a 63 y.o. female seen for follow up of Acute on Chronic Respiratory Failure.  Patient remains on ventilator on full support at this time.  Currently requiring 20% FiO2.  Desaturations with distress.  Medications: Reviewed on Rounds  Physical Exam:  Vitals: Pulse 102 respirations 26 BP 102/52 O2 76% temp 99.1  Ventilator Settings ventilator mode AC VC plus rate of 24 tidal volume 450 PEEP of 5 FiO2 40%  . General: Comfortable at this time . Eyes: Grossly normal lids, irises & conjunctiva . ENT: grossly tongue is normal . Neck: no obvious mass . Cardiovascular: S1 S2 normal no gallop . Respiratory: No rales or rhonchi noted . Abdomen: soft . Skin: no rash seen on limited exam . Musculoskeletal: not rigid . Psychiatric:unable to assess . Neurologic: no seizure no involuntary movements         Lab Data:   Basic Metabolic Panel: Recent Labs  Lab 10/19/18 1859 10/20/18 0147 10/20/18 1244 10/20/18 2139 10/21/18 0432  NA 144 147* 144 142 139  K 5.1 5.7* 4.6 4.4 4.6  CL 123* 123* 119* 116* 112*  CO2 11* 13* 15* 16* 14*  GLUCOSE 127* 140* 138* 121* 104*  BUN 101* 107* 111* 109* 112*  CREATININE 1.96* 2.15* 2.38* 2.48* 2.68*  CALCIUM 9.5 9.1 8.3* 8.1* 8.0*  MG  --  2.5*  --   --   --     ABG: Recent Labs  Lab 10/19/18 2054 10/20/18 0005 10/20/18 0429 10/20/18 0750 10/20/18 1415  PHART 7.020* 7.002* 7.056* 7.129* 7.146*  PCO2ART 49.5* 58.5* 47.1 45.9 42.6  PO2ART 112* 97.7 104 107 84.4  HCO3 12.2* 13.8* 12.6* 14.6* 14.1*  O2SAT 96.9 96.0 97.5 97.8 96.4    Liver Function Tests: Recent Labs  Lab 10/20/18 1244  AST 32  ALT 11  ALKPHOS 193*  BILITOT 0.6  PROT 5.6*  ALBUMIN 1.7*   No  results for input(s): LIPASE, AMYLASE in the last 168 hours. No results for input(s): AMMONIA in the last 168 hours.  CBC: Recent Labs  Lab 10/19/18 0528 10/19/18 1859 10/19/18 2316 10/20/18 0514 10/20/18 1244  WBC 42.0* 51.1* 50.3* 45.5* 34.9*  NEUTROABS  --   --   --   --  31.4*  HGB 6.9* 9.4* 8.4* 8.8* 7.4*  HCT 22.2* 30.3* 26.9* 28.9* 23.2*  MCV 102.8* 100.7* 101.9* 101.8* 100.0  PLT 464* 547* 487* 439* 374    Cardiac Enzymes: No results for input(s): CKTOTAL, CKMB, CKMBINDEX, TROPONINI in the last 168 hours.  BNP (last 3 results) Recent Labs    10/01/18 0600  BNP 593.8*    ProBNP (last 3 results) No results for input(s): PROBNP in the last 8760 hours.  Radiological Exams: Dg Chest Port 1 View  Result Date: 10/20/2018 CLINICAL DATA:  Respiratory failure EXAM: PORTABLE CHEST 1 VIEW COMPARISON:  October 18, 2018 FINDINGS: Tracheostomy catheter tip is 1.6 cm above the carina. No pneumothorax. There is extensive airspace opacity bilaterally, new from 2 days prior. Heart is upper normal in size with pulmonary vascularity normal. No adenopathy. No bone lesions. IMPRESSION: Tracheostomy as described without pneumothorax. Extensive airspace opacity bilaterally. Suspect multifocal pneumonia. A degree of pulmonary edema superimposed is a differential consideration as is ARDS. Stable cardiac  silhouette. Electronically Signed   By: Lowella Grip III M.D.   On: 10/20/2018 08:07    Assessment/Plan Active Problems:   Acute on chronic respiratory failure with hypoxia (HCC)   Influenza A with pneumonia   Hepatic steatosis   Chronic diastolic heart failure (HCC)   Gastric perforation, acute   Melena   Heme positive stool   Acute blood loss anemia   1. Acute on chronic respiratory failure with anoxia continue with full support on the ventilator at this time.  See sense above.  Continue to titrate protocol as patient can tolerate. 2. Influenza A pneumonia treated continue  supportive care 3. Chronic diastolic heart failure at baseline continue present management 4. Gastric recovery status post surgery   I have personally seen and evaluated the patient, evaluated laboratory and imaging results, formulated the assessment and plan and placed orders. The Patient requires high complexity decision making for assessment and support.  Case was discussed on Rounds with the Respiratory Therapy Staff  Allyne Gee, MD [facility] Pulmonary Critical Care Medicine Sleep Medicine

## 2018-10-22 ENCOUNTER — Other Ambulatory Visit (HOSPITAL_COMMUNITY): Payer: Medicare Other

## 2018-10-22 ENCOUNTER — Encounter (HOSPITAL_COMMUNITY): Payer: Self-pay | Admitting: Diagnostic Radiology

## 2018-10-22 DIAGNOSIS — K3189 Other diseases of stomach and duodenum: Secondary | ICD-10-CM | POA: Diagnosis not present

## 2018-10-22 DIAGNOSIS — J9621 Acute and chronic respiratory failure with hypoxia: Secondary | ICD-10-CM | POA: Diagnosis not present

## 2018-10-22 DIAGNOSIS — I5032 Chronic diastolic (congestive) heart failure: Secondary | ICD-10-CM | POA: Diagnosis not present

## 2018-10-22 DIAGNOSIS — K760 Fatty (change of) liver, not elsewhere classified: Secondary | ICD-10-CM | POA: Diagnosis not present

## 2018-10-22 HISTORY — PX: IR FLUORO GUIDE CV LINE RIGHT: IMG2283

## 2018-10-22 HISTORY — PX: IR US GUIDE VASC ACCESS RIGHT: IMG2390

## 2018-10-22 LAB — RENAL FUNCTION PANEL
Albumin: 1.4 g/dL — ABNORMAL LOW (ref 3.5–5.0)
Anion gap: 13 (ref 5–15)
BUN: 114 mg/dL — ABNORMAL HIGH (ref 8–23)
CO2: 14 mmol/L — ABNORMAL LOW (ref 22–32)
Calcium: 7.9 mg/dL — ABNORMAL LOW (ref 8.9–10.3)
Chloride: 103 mmol/L (ref 98–111)
Creatinine, Ser: 3.06 mg/dL — ABNORMAL HIGH (ref 0.44–1.00)
GFR calc Af Amer: 18 mL/min — ABNORMAL LOW (ref >60)
GFR calc non Af Amer: 15 mL/min — ABNORMAL LOW (ref >60)
Glucose, Bld: 114 mg/dL — ABNORMAL HIGH (ref 70–99)
Phosphorus: 6.4 mg/dL — ABNORMAL HIGH (ref 2.5–4.6)
Potassium: 3.7 mmol/L (ref 3.5–5.1)
Sodium: 130 mmol/L — ABNORMAL LOW (ref 135–145)

## 2018-10-22 LAB — CBC
HCT: 22.6 % — ABNORMAL LOW (ref 36.0–46.0)
Hemoglobin: 7.1 g/dL — ABNORMAL LOW (ref 12.0–15.0)
MCH: 30.6 pg (ref 26.0–34.0)
MCHC: 31.4 g/dL (ref 30.0–36.0)
MCV: 97.4 fL (ref 80.0–100.0)
Platelets: 265 10*3/uL (ref 150–400)
RBC: 2.32 MIL/uL — ABNORMAL LOW (ref 3.87–5.11)
RDW: 19.6 % — ABNORMAL HIGH (ref 11.5–15.5)
WBC: 34.1 10*3/uL — ABNORMAL HIGH (ref 4.0–10.5)
nRBC: 1.4 % — ABNORMAL HIGH (ref 0.0–0.2)

## 2018-10-22 MED ORDER — LIDOCAINE HCL 1 % IJ SOLN
INTRAMUSCULAR | Status: AC
  Filled 2018-10-22: qty 20

## 2018-10-22 MED ORDER — HEPARIN SODIUM (PORCINE) 1000 UNIT/ML IJ SOLN
INTRAMUSCULAR | Status: AC
  Administered 2018-10-22: 2.6 mL
  Filled 2018-10-22: qty 1

## 2018-10-22 MED ORDER — LIDOCAINE HCL (PF) 1 % IJ SOLN
INTRAMUSCULAR | Status: DC | PRN
  Administered 2018-10-22: 10 mL

## 2018-10-22 NOTE — Consult Note (Signed)
[facility] CONSULT NOTE    Date: 10/22/2018                  Patient Name:  Pamela Curry  MRN: [id number]  DOB: [birth year]  Age / Sex: 63 y.o., female         PCP: System, Pcp Not In                 Service Requesting Consult: Hospitalist                 Reason for Consult: Acute renal failure, metabolic acidosis            History of Present Illness: Patient is a 63 y.o. female with a PMHx of recent acute respiratory failure secondary to influenza A pneumonia, acute renal failure requiring CRRT previously, metabolic acidosis, gastric perforation status post repair, depression, GERD, hypertension, hypothyroidism, who was admitted to [facility] on 09/28/2018 for ongoing treatment of acute respiratory failure.  At the outside hospital she initially presented with shortness of breath and hypoxic respiratory failure and was found to be influenza a pneumonia.  She also developed acute renal failure during that admission and required CRRT.  She underwent surgical repair of gastric perforation on September 09, 2026 with revision of stump on September 17, 2018 after CT was positive for a leak.  Subsequently she underwent tracheostomy placement on September 23, 2018.  She was weaned off of CRRT in late March.  However she has now developed acute renal failure again with a BUN of 112 and creatinine of 2.68.  Urine output is also dropped and she has developed metabolic acidosis with a serum bicarbonate of 14 and pH of 7.14 PCO2 of 42.6.  We have been asked to evaluate her for reinitiation of dialysis treatments now.   Medications: Outpatient medications: Medications Prior to Admission  Medication Sig Dispense Refill Last Dose  . FLUoxetine (PROZAC) 10 MG tablet Take 10 mg by mouth daily.     Marland Kitchen levothyroxine (SYNTHROID, LEVOTHROID) 100 MCG tablet Take 100 mcg by mouth daily before breakfast.     . metoprolol-hydrochlorothiazide (LOPRESSOR HCT) 100-25 MG tablet Take 1 tablet by mouth daily.      . pantoprazole (PROTONIX) 20 MG tablet Take 20 mg by mouth daily.       Current medications: Humalog sliding scale insulin, Pepcid 20 mg twice daily, heparin 5000 subcutaneous every 8 hours, Lantus 12 units subcutaneous twice daily, metoprolol 2.5 mg daily, renal vitamin 1 tablet daily, thiamine 100 mg daily, vitamin C 500 mg daily, albumin 12.5 g IV every 8 hours  Allergies: Allergies  Allergen Reactions  . Codeine   . Hydrocodone   . Morphine And Related   . Propoxyphene   . Sulfa Antibiotics   . Tramadol   . Trazodone And Nefazodone   . Trimethoprim       Past Medical History: Past Medical History:  Diagnosis Date  . Acute on chronic respiratory failure with hypoxia ([place])   . Chronic diastolic heart failure ([place])   . Gastric perforation, acute   . Hepatic steatosis   . Influenza A with pneumonia      Past Surgical History: Past Surgical History:  Procedure Laterality Date  . ESOPHAGOGASTRODUODENOSCOPY (EGD) WITH PROPOFOL N/A 10/05/2018   Procedure: ESOPHAGOGASTRODUODENOSCOPY (EGD) WITH PROPOFOL;  Surgeon: Jerene Bears, MD;  Location: [facility];  Service: Gastroenterology;  Laterality: N/A;     Family History: Unable to obtain from the patient as she is  currently maintained on the ventilator.  Social History: Unable to obtain from the patient as she is currently maintained on the ventilator.  Review of Systems: Unable to obtain from the patient as she is currently maintained on the ventilator.  Vital Signs: Pulse 96 respirations 25 blood pressure 94/50 pulse ox 96% Weight trends: Filed Weights   10/05/18 1011  Weight: 108.9 kg    Physical Exam: General: Critically ill-appearing  Head: Normocephalic, atraumatic.  Eyes: Anicteric  Nose: Mucous membranes dry, not inflammed, nonerythematous.  Throat: Oropharynx nonerythematous, no exudate appreciated.   Neck: Tracheostomy in place  Lungs:  Tachypnea noted, scattered rhonchi bilateral  Heart: S1S2 no  rubs  Abdomen:  BS normoactive. Soft, Nondistended, non-tender.  No masses or organomegaly.  Extremities: Trace edema pretibial edema.  Neurologic: Not following commands  Skin: No visible rashes, scars.    Lab results: Basic Metabolic Panel: Recent Labs  Lab 10/20/18 0147 10/20/18 1244 10/20/18 2139 10/21/18 0432  NA 147* 144 142 139  K 5.7* 4.6 4.4 4.6  CL 123* 119* 116* 112*  CO2 13* 15* 16* 14*  GLUCOSE 140* 138* 121* 104*  BUN 107* 111* 109* 112*  CREATININE 2.15* 2.38* 2.48* 2.68*  CALCIUM 9.1 8.3* 8.1* 8.0*  MG 2.5*  --   --   --     Liver Function Tests: Recent Labs  Lab 10/20/18 1244  AST 32  ALT 11  ALKPHOS 193*  BILITOT 0.6  PROT 5.6*  ALBUMIN 1.7*   No results for input(s): LIPASE, AMYLASE in the last 168 hours. No results for input(s): AMMONIA in the last 168 hours.  CBC: Recent Labs  Lab 10/19/18 1859 10/19/18 2316 10/20/18 0514 10/20/18 1244 10/21/18 2331  WBC 51.1* 50.3* 45.5* 34.9* 34.9*  NEUTROABS  --   --   --  31.4*  --   HGB 9.4* 8.4* 8.8* 7.4* 7.4*  HCT 30.3* 26.9* 28.9* 23.2* 24.0*  MCV 100.7* 101.9* 101.8* 100.0 100.8*  PLT 547* 487* 439* 374 303    Cardiac Enzymes: No results for input(s): CKTOTAL, CKMB, CKMBINDEX, TROPONINI in the last 168 hours.  BNP: Invalid input(s): POCBNP  CBG: No results for input(s): GLUCAP in the last 168 hours.  Microbiology: Results for orders placed or performed during the hospital encounter of 09/28/18  C difficile quick scan w PCR reflex     Status: None   Collection Time: 09/28/18 11:15 PM  Result Value Ref Range Status   C Diff antigen NEGATIVE NEGATIVE Final   C Diff toxin NEGATIVE NEGATIVE Final   C Diff interpretation No C. difficile detected.  Final    Comment: Performed at [facility], [place] [address] [place], [place] [address]  Culture, respiratory     Status: None   Collection Time: 10/18/18  4:10 PM  Result Value Ref Range Status   Specimen Description TRACHEAL  ASPIRATE  Final   Special Requests NONE  Final   Gram Stain   Final    ABUNDANT WBC PRESENT, PREDOMINANTLY PMN ABUNDANT GRAM POSITIVE COCCI MODERATE YEAST    Culture   Final    Consistent with normal respiratory flora. Performed at [facility], [place] [address] [place], [place] [address]    Report Status 10/20/2018 FINAL  Final  Culture, blood (routine x 2)     Status: None (Preliminary result)   Collection Time: 10/19/18 10:03 AM  Result Value Ref Range Status   Specimen Description BLOOD RIGHT ANTECUBITAL  Final   Special  Requests   Final    BOTTLES DRAWN AEROBIC AND ANAEROBIC Blood Culture adequate volume   Culture   Final    NO GROWTH 2 DAYS Performed at [facility], [place] [address] [place], [place] [address]    Report Status PENDING  Incomplete  Culture, blood (routine x 2)     Status: None (Preliminary result)   Collection Time: 10/19/18 10:11 AM  Result Value Ref Range Status   Specimen Description BLOOD LEFT HAND  Final   Special Requests   Final    BOTTLES DRAWN AEROBIC AND ANAEROBIC Blood Culture adequate volume   Culture   Final    NO GROWTH 2 DAYS Performed at [facility], [place] [address], [place], [place] [address]    Report Status PENDING  Incomplete    Coagulation Studies: Recent Labs    10/21/18 2331  LABPROT 17.8*  INR 1.5*    Urinalysis: No results for input(s): COLORURINE, LABSPEC, PHURINE, GLUCOSEU, HGBUR, BILIRUBINUR, KETONESUR, PROTEINUR, UROBILINOGEN, NITRITE, LEUKOCYTESUR in the last 72 hours.  Invalid input(s): APPERANCEUR    Imaging:  No results found.   Assessment & Plan: Pt is a 63 y.o. female with a PMHx of recent acute respiratory failure secondary to influenza A pneumonia, acute renal failure requiring CRRT previously, metabolic acidosis, gastric perforation status post repair, depression, GERD, hypertension, hypothyroidism, who was admitted to [facility] on 09/28/2018 for ongoing treatment of acute respiratory  failure.   1.  Acute renal failure, recurrent. 2.  Metabolic acidosis. 3.  Acute respiratory failure. 4.  Anemia unspecified.  Plan: Patient critically ill at the moment.  She has history of gastric perforation status post repair and also has acute uppercase failure requiring mechanical ventilation via tracheostomy.  During her previous hospitalization she required CRRT for acute renal failure.  She has recurrent acute renal failure now.  We will request temporary dialysis catheter placement through interventional radiology.  Thereafter we will begin the patient on dialysis treatment.  Hopefully this will help to treat her underlying metabolic acidosis.  We will plan for dialysis today as well as tomorrow.  Further plan as the patient progresses.  Thanks for consultation.

## 2018-10-22 NOTE — Progress Notes (Addendum)
Pulmonary Critical Care Medicine [place]   PULMONARY CRITICAL CARE SERVICE  PROGRESS NOTE  Date of Service: 10/22/2018  Pamela Curry  [id number]:[id number]  DOB: Feb 24, 1956   DOA: 09/28/2018  Referring Physician: Merton Border, MD  HPI: Pamela Curry is a 63 y.o. female seen for follow up of Acute on Chronic Respiratory Failure.  Patient remains full support on the ventilator unable to tolerate weaning today.  Currently requiring 20% O2.    Medications: Reviewed on Rounds  Physical Exam:  Vitals: Pulse 95 respirations 28 BP 103/57 O2 sat 100% temp 98.1  Ventilator Settings ventilator mode AC VC plus rate of 24 tidal volume 450 PEEP of 5 FiO2 40%.  . General: Comfortable at this time . Eyes: Grossly normal lids, irises & conjunctiva . ENT: grossly tongue is normal . Neck: no obvious mass . Cardiovascular: S1 S2 normal no gallop . Respiratory: No rales or rhonchi noted . Abdomen: soft . Skin: no rash seen on limited exam . Musculoskeletal: not rigid . Psychiatric:unable to assess . Neurologic: no seizure no involuntary movements         Lab Data:   Basic Metabolic Panel: Recent Labs  Lab 10/20/18 0147 10/20/18 1244 10/20/18 2139 10/21/18 0432 10/22/18 1523  NA 147* 144 142 139 130*  K 5.7* 4.6 4.4 4.6 3.7  CL 123* 119* 116* 112* 103  CO2 13* 15* 16* 14* 14*  GLUCOSE 140* 138* 121* 104* 114*  BUN 107* 111* 109* 112* 114*  CREATININE 2.15* 2.38* 2.48* 2.68* 3.06*  CALCIUM 9.1 8.3* 8.1* 8.0* 7.9*  MG 2.5*  --   --   --   --   PHOS  --   --   --   --  6.4*    ABG: Recent Labs  Lab 10/19/18 2054 10/20/18 0005 10/20/18 0429 10/20/18 0750 10/20/18 1415  PHART 7.020* 7.002* 7.056* 7.129* 7.146*  PCO2ART 49.5* 58.5* 47.1 45.9 42.6  PO2ART 112* 97.7 104 107 84.4  HCO3 12.2* 13.8* 12.6* 14.6* 14.1*  O2SAT 96.9 96.0 97.5 97.8 96.4    Liver Function Tests: Recent Labs  Lab 10/20/18 1244 10/22/18 1523  AST 32  --   ALT 11  --   ALKPHOS 193*   --   BILITOT 0.6  --   PROT 5.6*  --   ALBUMIN 1.7* 1.4*   No results for input(s): LIPASE, AMYLASE in the last 168 hours. No results for input(s): AMMONIA in the last 168 hours.  CBC: Recent Labs  Lab 10/19/18 2316 10/20/18 0514 10/20/18 1244 10/21/18 2331 10/22/18 1523  WBC 50.3* 45.5* 34.9* 34.9* 34.1*  NEUTROABS  --   --  31.4*  --   --   HGB 8.4* 8.8* 7.4* 7.4* 7.1*  HCT 26.9* 28.9* 23.2* 24.0* 22.6*  MCV 101.9* 101.8* 100.0 100.8* 97.4  PLT 487* 439* 374 303 265    Cardiac Enzymes: No results for input(s): CKTOTAL, CKMB, CKMBINDEX, TROPONINI in the last 168 hours.  BNP (last 3 results) Recent Labs    10/01/18 0600  BNP 593.8*    ProBNP (last 3 results) No results for input(s): PROBNP in the last 8760 hours.  Radiological Exams: Ir Cyndy Freeze Guide Cv Line Right  Result Date: 10/22/2018 INDICATION: 63 year old with renal failure and needing dialysis. EXAM: FLUOROSCOPIC AND ULTRASOUND GUIDED PLACEMENT OF A NON-TUNNELED DIALYSIS CATHETER Physician: Stephan Minister. Henn, MD MEDICATIONS: None ANESTHESIA/SEDATION: None FLUOROSCOPY TIME:  Fluoroscopy Time: 24 seconds, 4 mGy COMPLICATIONS: None immediate. PROCEDURE: Informed consent was obtained for  catheter placement. The patient was placed supine on the interventional table. Ultrasound confirmed a patent right internal jugular vein. Ultrasound images were obtained for documentation. The right side of the neck was prepped and draped in a sterile fashion. The right neck was anesthetized with 1% lidocaine. Maximal barrier sterile technique was utilized including caps, mask, sterile gowns, sterile gloves, sterile drape, hand hygiene and skin antiseptic. A small incision was made with #11 blade scalpel. A 21 gauge needle directed into the right internal jugular vein with ultrasound guidance. A micropuncture dilator set was placed. A 16 cm Mahurkar catheter was selected. The catheter was advanced over a wire and positioned in the lower SVC.  Fluoroscopic images were obtained for documentation. Both dialysis lumens were found to aspirate and flush well. The proper amount of heparin was flushed in both lumens. The central venous lumen was flushed with normal saline. Catheter was sutured to skin. FINDINGS: Catheter tip in the lower SVC. IMPRESSION: Successful placement of a right jugular non-tunneled dialysis catheter using ultrasound and fluoroscopic guidance. Electronically Signed   By: Markus Daft M.D.   On: 10/22/2018 17:20   Ir US Guide Vasc Access Right  Result Date: 10/22/2018 INDICATION: 63 year old with renal failure and needing dialysis. EXAM: FLUOROSCOPIC AND ULTRASOUND GUIDED PLACEMENT OF A NON-TUNNELED DIALYSIS CATHETER Physician: Stephan Minister. Henn, MD MEDICATIONS: None ANESTHESIA/SEDATION: None FLUOROSCOPY TIME:  Fluoroscopy Time: 24 seconds, 4 mGy COMPLICATIONS: None immediate. PROCEDURE: Informed consent was obtained for catheter placement. The patient was placed supine on the interventional table. Ultrasound confirmed a patent right internal jugular vein. Ultrasound images were obtained for documentation. The right side of the neck was prepped and draped in a sterile fashion. The right neck was anesthetized with 1% lidocaine. Maximal barrier sterile technique was utilized including caps, mask, sterile gowns, sterile gloves, sterile drape, hand hygiene and skin antiseptic. A small incision was made with #11 blade scalpel. A 21 gauge needle directed into the right internal jugular vein with ultrasound guidance. A micropuncture dilator set was placed. A 16 cm Mahurkar catheter was selected. The catheter was advanced over a wire and positioned in the lower SVC. Fluoroscopic images were obtained for documentation. Both dialysis lumens were found to aspirate and flush well. The proper amount of heparin was flushed in both lumens. The central venous lumen was flushed with normal saline. Catheter was sutured to skin. FINDINGS: Catheter tip in the  lower SVC. IMPRESSION: Successful placement of a right jugular non-tunneled dialysis catheter using ultrasound and fluoroscopic guidance. Electronically Signed   By: Markus Daft M.D.   On: 10/22/2018 17:20    Assessment/Plan Active Problems:   Acute on chronic respiratory failure with hypoxia (HCC)   Influenza A with pneumonia   Hepatic steatosis   Chronic diastolic heart failure (HCC)   Gastric perforation, acute   Melena   Heme positive stool   Acute blood loss anemia   1. Acute on chronic respiratory failure with hypoxia continue with full support on the ventilator at this time.  Continue attempt weaning as per protocol. 2. Influenza A pneumonia treated continue supportive care 3. Chronic diastolic heart failure at baseline continue present management 4. Gastric perforation status post surgical repair.   I have personally seen and evaluated the patient, evaluated laboratory and imaging results, formulated the assessment and plan and placed orders. The Patient requires high complexity decision making for assessment and support.  Case was discussed on Rounds with the Respiratory Therapy Staff  Allyne Gee, MD [facility] Pulmonary  Critical Care Medicine Sleep Medicine

## 2018-10-22 NOTE — Procedures (Signed)
Interventional Radiology Procedure:   Indications:  Acute renal failure  Procedure: Placement of non-tunneled dialysis catheter  Findings: Right jugular catheter, tip in lower SVC  Complications: None     EBL: Less than 10 ml  Plan: Catheter is ready to use.     Newell Wafer R. Anselm Pancoast, MD  Pager: [phone]

## 2018-10-23 ENCOUNTER — Other Ambulatory Visit (HOSPITAL_COMMUNITY): Payer: Medicare Other

## 2018-10-23 DIAGNOSIS — K3189 Other diseases of stomach and duodenum: Secondary | ICD-10-CM | POA: Diagnosis not present

## 2018-10-23 DIAGNOSIS — K760 Fatty (change of) liver, not elsewhere classified: Secondary | ICD-10-CM | POA: Diagnosis not present

## 2018-10-23 DIAGNOSIS — J9621 Acute and chronic respiratory failure with hypoxia: Secondary | ICD-10-CM | POA: Diagnosis not present

## 2018-10-23 DIAGNOSIS — I5032 Chronic diastolic (congestive) heart failure: Secondary | ICD-10-CM | POA: Diagnosis not present

## 2018-10-23 LAB — BLOOD GAS, ARTERIAL
Acid-base deficit: 10.1 mmol/L — ABNORMAL HIGH (ref 0.0–2.0)
Bicarbonate: 15 mmol/L — ABNORMAL LOW (ref 20.0–28.0)
FIO2: 35
O2 Saturation: 90.9 %
PEEP: 5 cmH2O
Patient temperature: 98.6
Pressure control: 18 cmH2O
RATE: 18 resp/min
pCO2 arterial: 31 mmHg — ABNORMAL LOW (ref 32.0–48.0)
pH, Arterial: 7.306 — ABNORMAL LOW (ref 7.350–7.450)
pO2, Arterial: 58.9 mmHg — ABNORMAL LOW (ref 83.0–108.0)

## 2018-10-23 LAB — CBC
HCT: 24.4 % — ABNORMAL LOW (ref 36.0–46.0)
Hemoglobin: 7.6 g/dL — ABNORMAL LOW (ref 12.0–15.0)
MCH: 30.9 pg (ref 26.0–34.0)
MCHC: 31.1 g/dL (ref 30.0–36.0)
MCV: 99.2 fL (ref 80.0–100.0)
Platelets: 276 10*3/uL (ref 150–400)
RBC: 2.46 MIL/uL — ABNORMAL LOW (ref 3.87–5.11)
RDW: 19.2 % — ABNORMAL HIGH (ref 11.5–15.5)
WBC: 39.7 10*3/uL — ABNORMAL HIGH (ref 4.0–10.5)
nRBC: 1.8 % — ABNORMAL HIGH (ref 0.0–0.2)

## 2018-10-23 LAB — RENAL FUNCTION PANEL
Albumin: 1.4 g/dL — ABNORMAL LOW (ref 3.5–5.0)
Anion gap: 13 (ref 5–15)
BUN: 115 mg/dL — ABNORMAL HIGH (ref 8–23)
CO2: 14 mmol/L — ABNORMAL LOW (ref 22–32)
Calcium: 8 mg/dL — ABNORMAL LOW (ref 8.9–10.3)
Chloride: 99 mmol/L (ref 98–111)
Creatinine, Ser: 3.22 mg/dL — ABNORMAL HIGH (ref 0.44–1.00)
GFR calc Af Amer: 17 mL/min — ABNORMAL LOW (ref >60)
GFR calc non Af Amer: 15 mL/min — ABNORMAL LOW (ref >60)
Glucose, Bld: 151 mg/dL — ABNORMAL HIGH (ref 70–99)
Phosphorus: 7.4 mg/dL — ABNORMAL HIGH (ref 2.5–4.6)
Potassium: 3.8 mmol/L (ref 3.5–5.1)
Sodium: 126 mmol/L — ABNORMAL LOW (ref 135–145)

## 2018-10-23 LAB — HEPATITIS B SURFACE ANTIGEN: Hepatitis B Surface Ag: NEGATIVE

## 2018-10-23 LAB — VANCOMYCIN, TROUGH: Vancomycin Tr: 25 ug/mL (ref 15–20)

## 2018-10-23 NOTE — Progress Notes (Signed)
Spoke with attending MD regarding pt's HD tx tomorrow, Per MD to hold  HD tx on Sunday . Nephrologist made aware.

## 2018-10-23 NOTE — Progress Notes (Addendum)
Pulmonary Critical Care Medicine [place]   PULMONARY CRITICAL CARE SERVICE  PROGRESS NOTE  Date of Service: 10/23/2018  Pamela Curry  ZHY:[id number]  DOB: 12/23/1955   DOA: 09/28/2018  Referring Physician: Merton Border, MD  HPI: Pamela Curry is a 63 y.o. female seen for follow up of Acute on Chronic Respiratory Failure.  Patient remains on ventilator pressure control mode at this time.  Currently satting well requiring 35% FiO2.  Medications: Reviewed on Rounds  Physical Exam:  Vitals: Pulse 86 respiration 24 BP 168/49 O2 sat 100% temp 97.1  Ventilator Settings ventilator mode AC PC rate of 18 inspiratory pressure of 18 PEEP of 5 FiO2 35%  . General: Comfortable at this time . Eyes: Grossly normal lids, irises & conjunctiva . ENT: grossly tongue is normal . Neck: no obvious mass . Cardiovascular: S1 S2 normal no gallop . Respiratory: No rales or rhonchi noted . Abdomen: soft . Skin: no rash seen on limited exam . Musculoskeletal: not rigid . Psychiatric:unable to assess . Neurologic: no seizure no involuntary movements         Lab Data:   Basic Metabolic Panel: Recent Labs  Lab 10/20/18 0147 10/20/18 1244 10/20/18 2139 10/21/18 0432 10/22/18 1523 10/23/18 0313  NA 147* 144 142 139 130* 126*  K 5.7* 4.6 4.4 4.6 3.7 3.8  CL 123* 119* 116* 112* 103 99  CO2 13* 15* 16* 14* 14* 14*  GLUCOSE 140* 138* 121* 104* 114* 151*  BUN 107* 111* 109* 112* 114* 115*  CREATININE 2.15* 2.38* 2.48* 2.68* 3.06* 3.22*  CALCIUM 9.1 8.3* 8.1* 8.0* 7.9* 8.0*  MG 2.5*  --   --   --   --   --   PHOS  --   --   --   --  6.4* 7.4*    ABG: Recent Labs  Lab 10/19/18 2054 10/20/18 0005 10/20/18 0429 10/20/18 0750 10/20/18 1415  PHART 7.020* 7.002* 7.056* 7.129* 7.146*  PCO2ART 49.5* 58.5* 47.1 45.9 42.6  PO2ART 112* 97.7 104 107 84.4  HCO3 12.2* 13.8* 12.6* 14.6* 14.1*  O2SAT 96.9 96.0 97.5 97.8 96.4    Liver Function Tests: Recent Labs  Lab  10/20/18 1244 10/22/18 1523 10/23/18 0313  AST 32  --   --   ALT 11  --   --   ALKPHOS 193*  --   --   BILITOT 0.6  --   --   PROT 5.6*  --   --   ALBUMIN 1.7* 1.4* 1.4*   No results for input(s): LIPASE, AMYLASE in the last 168 hours. No results for input(s): AMMONIA in the last 168 hours.  CBC: Recent Labs  Lab 10/20/18 0514 10/20/18 1244 10/21/18 2331 10/22/18 1523 10/23/18 0313  WBC 45.5* 34.9* 34.9* 34.1* 39.7*  NEUTROABS  --  31.4*  --   --   --   HGB 8.8* 7.4* 7.4* 7.1* 7.6*  HCT 28.9* 23.2* 24.0* 22.6* 24.4*  MCV 101.8* 100.0 100.8* 97.4 99.2  PLT 439* 374 303 265 276    Cardiac Enzymes: No results for input(s): CKTOTAL, CKMB, CKMBINDEX, TROPONINI in the last 168 hours.  BNP (last 3 results) Recent Labs    10/01/18 0600  BNP 593.8*    ProBNP (last 3 results) No results for input(s): PROBNP in the last 8760 hours.  Radiological Exams: Ir Cyndy Freeze Guide Cv Line Right  Result Date: 10/22/2018 INDICATION: 63 year old with renal failure and needing dialysis. EXAM: FLUOROSCOPIC AND ULTRASOUND GUIDED PLACEMENT OF A  NON-TUNNELED DIALYSIS CATHETER Physician: Stephan Minister. Henn, MD MEDICATIONS: None ANESTHESIA/SEDATION: None FLUOROSCOPY TIME:  Fluoroscopy Time: 24 seconds, 4 mGy COMPLICATIONS: None immediate. PROCEDURE: Informed consent was obtained for catheter placement. The patient was placed supine on the interventional table. Ultrasound confirmed a patent right internal jugular vein. Ultrasound images were obtained for documentation. The right side of the neck was prepped and draped in a sterile fashion. The right neck was anesthetized with 1% lidocaine. Maximal barrier sterile technique was utilized including caps, mask, sterile gowns, sterile gloves, sterile drape, hand hygiene and skin antiseptic. A small incision was made with #11 blade scalpel. A 21 gauge needle directed into the right internal jugular vein with ultrasound guidance. A micropuncture dilator set was placed.  A 16 cm Mahurkar catheter was selected. The catheter was advanced over a wire and positioned in the lower SVC. Fluoroscopic images were obtained for documentation. Both dialysis lumens were found to aspirate and flush well. The proper amount of heparin was flushed in both lumens. The central venous lumen was flushed with normal saline. Catheter was sutured to skin. FINDINGS: Catheter tip in the lower SVC. IMPRESSION: Successful placement of a right jugular non-tunneled dialysis catheter using ultrasound and fluoroscopic guidance. Electronically Signed   By: Markus Daft M.D.   On: 10/22/2018 17:20   Ir US Guide Vasc Access Right  Result Date: 10/22/2018 INDICATION: 63 year old with renal failure and needing dialysis. EXAM: FLUOROSCOPIC AND ULTRASOUND GUIDED PLACEMENT OF A NON-TUNNELED DIALYSIS CATHETER Physician: Stephan Minister. Henn, MD MEDICATIONS: None ANESTHESIA/SEDATION: None FLUOROSCOPY TIME:  Fluoroscopy Time: 24 seconds, 4 mGy COMPLICATIONS: None immediate. PROCEDURE: Informed consent was obtained for catheter placement. The patient was placed supine on the interventional table. Ultrasound confirmed a patent right internal jugular vein. Ultrasound images were obtained for documentation. The right side of the neck was prepped and draped in a sterile fashion. The right neck was anesthetized with 1% lidocaine. Maximal barrier sterile technique was utilized including caps, mask, sterile gowns, sterile gloves, sterile drape, hand hygiene and skin antiseptic. A small incision was made with #11 blade scalpel. A 21 gauge needle directed into the right internal jugular vein with ultrasound guidance. A micropuncture dilator set was placed. A 16 cm Mahurkar catheter was selected. The catheter was advanced over a wire and positioned in the lower SVC. Fluoroscopic images were obtained for documentation. Both dialysis lumens were found to aspirate and flush well. The proper amount of heparin was flushed in both lumens. The  central venous lumen was flushed with normal saline. Catheter was sutured to skin. FINDINGS: Catheter tip in the lower SVC. IMPRESSION: Successful placement of a right jugular non-tunneled dialysis catheter using ultrasound and fluoroscopic guidance. Electronically Signed   By: Markus Daft M.D.   On: 10/22/2018 17:20   Dg Chest Port 1 View  Result Date: 10/23/2018 CLINICAL DATA:  Respiratory failure. EXAM: PORTABLE CHEST 1 VIEW COMPARISON:  Chest x-ray dated October 20, 2018. FINDINGS: Interval placement of a right internal jugular dialysis catheter with the tip at the cavoatrial junction. Unchanged tracheostomy tube. Stable cardiomediastinal silhouette. Persistent interstitial and airspace opacities in the central and basilar left lung. Slightly improved aeration at the right lung base. No acute osseous abnormality. IMPRESSION: 1. Persistent left central and basilar airspace disease, concerning for pneumonia. 2. Improved aeration at the right lung base. Electronically Signed   By: Titus Dubin M.D.   On: 10/23/2018 07:09    Assessment/Plan Active Problems:   Acute on chronic respiratory failure with hypoxia (HCC)  Influenza A with pneumonia   Hepatic steatosis   Chronic diastolic heart failure (HCC)   Gastric perforation, acute   Melena   Heme positive stool   Acute blood loss anemia   1. Acute on chronic respiratory failure with hypoxia continue full support on ventilator at this time.  Continue to attempt weaning per protocol. 2. Influenza A pneumonia treated continue supportive care 3. Chronic diastolic heart failure at baseline continue present management 4. Gastric perforation status post surgical repair   I have personally seen and evaluated the patient, evaluated laboratory and imaging results, formulated the assessment and plan and placed orders. The Patient requires high complexity decision making for assessment and support.  Case was discussed on Rounds with the Respiratory  Therapy Staff  Allyne Gee, MD [facility] Pulmonary Critical Care Medicine Sleep Medicine

## 2018-10-24 DIAGNOSIS — J9621 Acute and chronic respiratory failure with hypoxia: Secondary | ICD-10-CM | POA: Diagnosis not present

## 2018-10-24 DIAGNOSIS — K3189 Other diseases of stomach and duodenum: Secondary | ICD-10-CM | POA: Diagnosis not present

## 2018-10-24 DIAGNOSIS — I5032 Chronic diastolic (congestive) heart failure: Secondary | ICD-10-CM | POA: Diagnosis not present

## 2018-10-24 DIAGNOSIS — K760 Fatty (change of) liver, not elsewhere classified: Secondary | ICD-10-CM | POA: Diagnosis not present

## 2018-10-24 LAB — CULTURE, BLOOD (ROUTINE X 2)
Culture: NO GROWTH
Culture: NO GROWTH
Special Requests: ADEQUATE
Special Requests: ADEQUATE

## 2018-10-24 NOTE — Progress Notes (Signed)
[facility]  ROUNDING NOTE   Subjective:  Patient remains critically ill. She did end up having her first dialysis treatment performed yesterday. Second Alysis treatment placed on hold.   Objective:  Vital signs in last 24 hours:  Temperature 98.1 pulse 90 respirations 24 blood pressure 91/37  Weight change:  Filed Weights   10/05/18 1011  Weight: 108.9 kg   Physical Exam: General:  Critically ill-appearing  Head: Normocephalic, atraumatic. Dry oral mucosal membranes  Eyes: Anicteric  Neck: Tracheostomy in place  Lungs:  Scattered rhonchi, vent assisted  Heart: S1S2 no rubs  Abdomen:  Soft, nontender, bowel sounds present  Extremities: trace peripheral edema.  Neurologic: Not responding to verbal or physical stimuli  Skin: No lesions  Access: Right internal jugular temporary dialysis catheter    Basic Metabolic Panel: Recent Labs  Lab 10/20/18 0147 10/20/18 1244 10/20/18 2139 10/21/18 0432 10/22/18 1523 10/23/18 0313  NA 147* 144 142 139 130* 126*  K 5.7* 4.6 4.4 4.6 3.7 3.8  CL 123* 119* 116* 112* 103 99  CO2 13* 15* 16* 14* 14* 14*  GLUCOSE 140* 138* 121* 104* 114* 151*  BUN 107* 111* 109* 112* 114* 115*  CREATININE 2.15* 2.38* 2.48* 2.68* 3.06* 3.22*  CALCIUM 9.1 8.3* 8.1* 8.0* 7.9* 8.0*  MG 2.5*  --   --   --   --   --   PHOS  --   --   --   --  6.4* 7.4*    Liver Function Tests: Recent Labs  Lab 10/20/18 1244 10/22/18 1523 10/23/18 0313  AST 32  --   --   ALT 11  --   --   ALKPHOS 193*  --   --   BILITOT 0.6  --   --   PROT 5.6*  --   --   ALBUMIN 1.7* 1.4* 1.4*   No results for input(s): LIPASE, AMYLASE in the last 168 hours. No results for input(s): AMMONIA in the last 168 hours.  CBC: Recent Labs  Lab 10/20/18 0514 10/20/18 1244 10/21/18 2331 10/22/18 1523 10/23/18 0313  WBC 45.5* 34.9* 34.9* 34.1* 39.7*  NEUTROABS  --  31.4*  --   --   --   HGB 8.8* 7.4* 7.4* 7.1* 7.6*  HCT 28.9* 23.2* 24.0* 22.6* 24.4*  MCV  101.8* 100.0 100.8* 97.4 99.2  PLT 439* 374 303 265 276    Cardiac Enzymes: No results for input(s): CKTOTAL, CKMB, CKMBINDEX, TROPONINI in the last 168 hours.  BNP: Invalid input(s): POCBNP  CBG: No results for input(s): GLUCAP in the last 168 hours.  Microbiology: Results for orders placed or performed during the hospital encounter of 09/28/18  C difficile quick scan w PCR reflex     Status: None   Collection Time: 09/28/18 11:15 PM  Result Value Ref Range Status   C Diff antigen NEGATIVE NEGATIVE Final   C Diff toxin NEGATIVE NEGATIVE Final   C Diff interpretation No C. difficile detected.  Final    Comment: Performed at [facility], [place] [address], [place], [place] [address]  Culture, respiratory     Status: None   Collection Time: 10/18/18  4:10 PM  Result Value Ref Range Status   Specimen Description TRACHEAL ASPIRATE  Final   Special Requests NONE  Final   Gram Stain   Final    ABUNDANT WBC PRESENT, PREDOMINANTLY PMN ABUNDANT GRAM POSITIVE COCCI MODERATE YEAST    Culture   Final  Consistent with normal respiratory flora. Performed at [facility], [place] [address] [place], [place] [address]    Report Status 10/20/2018 FINAL  Final  Culture, blood (routine x 2)     Status: None   Collection Time: 10/19/18 10:03 AM  Result Value Ref Range Status   Specimen Description BLOOD RIGHT ANTECUBITAL  Final   Special Requests   Final    BOTTLES DRAWN AEROBIC AND ANAEROBIC Blood Culture adequate volume   Culture   Final    NO GROWTH 5 DAYS Performed at [facility], [place] [address], [place], [place] [address]    Report Status 10/24/2018 FINAL  Final  Culture, blood (routine x 2)     Status: None   Collection Time: 10/19/18 10:11 AM  Result Value Ref Range Status   Specimen Description BLOOD LEFT HAND  Final   Special Requests   Final    BOTTLES DRAWN AEROBIC AND ANAEROBIC Blood Culture adequate volume   Culture   Final    NO GROWTH 5  DAYS Performed at [facility], [place] [address] [place], [place] [address]    Report Status 10/24/2018 FINAL  Final    Coagulation Studies: Recent Labs    10/21/18 2331  LABPROT 17.8*  INR 1.5*    Urinalysis: No results for input(s): COLORURINE, LABSPEC, PHURINE, GLUCOSEU, HGBUR, BILIRUBINUR, KETONESUR, PROTEINUR, UROBILINOGEN, NITRITE, LEUKOCYTESUR in the last 72 hours.  Invalid input(s): APPERANCEUR    Imaging: Ir Fluoro Guide Cv Line Right  Result Date: 10/22/2018 INDICATION: 63 year old with renal failure and needing dialysis. EXAM: FLUOROSCOPIC AND ULTRASOUND GUIDED PLACEMENT OF A NON-TUNNELED DIALYSIS CATHETER Physician: Stephan Minister. Henn, MD MEDICATIONS: None ANESTHESIA/SEDATION: None FLUOROSCOPY TIME:  Fluoroscopy Time: 24 seconds, 4 mGy COMPLICATIONS: None immediate. PROCEDURE: Informed consent was obtained for catheter placement. The patient was placed supine on the interventional table. Ultrasound confirmed a patent right internal jugular vein. Ultrasound images were obtained for documentation. The right side of the neck was prepped and draped in a sterile fashion. The right neck was anesthetized with 1% lidocaine. Maximal barrier sterile technique was utilized including caps, mask, sterile gowns, sterile gloves, sterile drape, hand hygiene and skin antiseptic. A small incision was made with #11 blade scalpel. A 21 gauge needle directed into the right internal jugular vein with ultrasound guidance. A micropuncture dilator set was placed. A 16 cm Mahurkar catheter was selected. The catheter was advanced over a wire and positioned in the lower SVC. Fluoroscopic images were obtained for documentation. Both dialysis lumens were found to aspirate and flush well. The proper amount of heparin was flushed in both lumens. The central venous lumen was flushed with normal saline. Catheter was sutured to skin. FINDINGS: Catheter tip in the lower SVC. IMPRESSION: Successful placement of a right  jugular non-tunneled dialysis catheter using ultrasound and fluoroscopic guidance. Electronically Signed   By: Markus Daft M.D.   On: 10/22/2018 17:20   Ir US Guide Vasc Access Right  Result Date: 10/22/2018 INDICATION: 63 year old with renal failure and needing dialysis. EXAM: FLUOROSCOPIC AND ULTRASOUND GUIDED PLACEMENT OF A NON-TUNNELED DIALYSIS CATHETER Physician: Stephan Minister. Henn, MD MEDICATIONS: None ANESTHESIA/SEDATION: None FLUOROSCOPY TIME:  Fluoroscopy Time: 24 seconds, 4 mGy COMPLICATIONS: None immediate. PROCEDURE: Informed consent was obtained for catheter placement. The patient was placed supine on the interventional table. Ultrasound confirmed a patent right internal jugular vein. Ultrasound images were obtained for documentation. The right side of the neck was prepped and draped in a sterile fashion.  The right neck was anesthetized with 1% lidocaine. Maximal barrier sterile technique was utilized including caps, mask, sterile gowns, sterile gloves, sterile drape, hand hygiene and skin antiseptic. A small incision was made with #11 blade scalpel. A 21 gauge needle directed into the right internal jugular vein with ultrasound guidance. A micropuncture dilator set was placed. A 16 cm Mahurkar catheter was selected. The catheter was advanced over a wire and positioned in the lower SVC. Fluoroscopic images were obtained for documentation. Both dialysis lumens were found to aspirate and flush well. The proper amount of heparin was flushed in both lumens. The central venous lumen was flushed with normal saline. Catheter was sutured to skin. FINDINGS: Catheter tip in the lower SVC. IMPRESSION: Successful placement of a right jugular non-tunneled dialysis catheter using ultrasound and fluoroscopic guidance. Electronically Signed   By: Markus Daft M.D.   On: 10/22/2018 17:20   Dg Chest Port 1 View  Result Date: 10/23/2018 CLINICAL DATA:  Respiratory failure. EXAM: PORTABLE CHEST 1 VIEW COMPARISON:  Chest x-ray  dated October 20, 2018. FINDINGS: Interval placement of a right internal jugular dialysis catheter with the tip at the cavoatrial junction. Unchanged tracheostomy tube. Stable cardiomediastinal silhouette. Persistent interstitial and airspace opacities in the central and basilar left lung. Slightly improved aeration at the right lung base. No acute osseous abnormality. IMPRESSION: 1. Persistent left central and basilar airspace disease, concerning for pneumonia. 2. Improved aeration at the right lung base. Electronically Signed   By: Titus Dubin M.D.   On: 10/23/2018 07:09     Medications:     lidocaine (PF)  Assessment/ Plan:  63 y.o. female with a PMHx of recent acute respiratory failure secondary to influenza A pneumonia, acute renal failure requiring CRRT previously, metabolic acidosis, gastric perforation status post repair, depression, GERD, hypertension, hypothyroidism, who was admitted to [facility] on 09/28/2018 for ongoing treatment of acute respiratory failure.   1.  Acute renal failure, recurrent. 2.  Metabolic acidosis. 3.  Acute respiratory failure. 4.  Anemia unspecified.  Plan: The patient remains critically ill at the moment.  Remains hypotensive.  Case discussed delayed hospitalist.  We decided to hold off on dialysis today and we will plan for dialysis treatment tomorrow using pressors if needed.  Overall patient continues to have a very guarded prognosis.  Acid-base status did improve a bit however.  Further plan as patient progresses.   LOS: 0 Pamela Curry 5/3/20202:28 PM

## 2018-10-24 NOTE — Progress Notes (Addendum)
Pulmonary Critical Care Medicine [place]   PULMONARY CRITICAL CARE SERVICE  PROGRESS NOTE  Date of Service: 10/24/2018  Pamela Curry  DPO:[id number]  DOB: 1956-03-23   DOA: 09/28/2018  Referring Physician: Merton Border, MD  HPI: Pamela Curry is a 63 y.o. female seen for follow up of Acute on Chronic Respiratory Failure.  Patient remains AC PC on the vent rate 18 FiO2 35%.  Currently satting in the upper 90s with no distress.  Medications: Reviewed on Rounds  Physical Exam:  Vitals: Pulse 90 respirations 24 BP 91/47 O2 sat 97% temp 98.1  Ventilator Settings ventilator mode AC PC rate of 18 inspiratory pressure 18 PEEP of 5 FiO2 35%  . General: Comfortable at this time . Eyes: Grossly normal lids, irises & conjunctiva . ENT: grossly tongue is normal . Neck: no obvious mass . Cardiovascular: S1 S2 normal no gallop . Respiratory: No rales or rhonchi noted . Abdomen: soft . Skin: no rash seen on limited exam . Musculoskeletal: not rigid . Psychiatric:unable to assess . Neurologic: no seizure no involuntary movements         Lab Data:   Basic Metabolic Panel: Recent Labs  Lab 10/20/18 0147 10/20/18 1244 10/20/18 2139 10/21/18 0432 10/22/18 1523 10/23/18 0313  NA 147* 144 142 139 130* 126*  K 5.7* 4.6 4.4 4.6 3.7 3.8  CL 123* 119* 116* 112* 103 99  CO2 13* 15* 16* 14* 14* 14*  GLUCOSE 140* 138* 121* 104* 114* 151*  BUN 107* 111* 109* 112* 114* 115*  CREATININE 2.15* 2.38* 2.48* 2.68* 3.06* 3.22*  CALCIUM 9.1 8.3* 8.1* 8.0* 7.9* 8.0*  MG 2.5*  --   --   --   --   --   PHOS  --   --   --   --  6.4* 7.4*    ABG: Recent Labs  Lab 10/20/18 0005 10/20/18 0429 10/20/18 0750 10/20/18 1415 10/23/18 2012  PHART 7.002* 7.056* 7.129* 7.146* 7.306*  PCO2ART 58.5* 47.1 45.9 42.6 31.0*  PO2ART 97.7 104 107 84.4 58.9*  HCO3 13.8* 12.6* 14.6* 14.1* 15.0*  O2SAT 96.0 97.5 97.8 96.4 90.9    Liver Function Tests: Recent Labs  Lab 10/20/18 1244  10/22/18 1523 10/23/18 0313  AST 32  --   --   ALT 11  --   --   ALKPHOS 193*  --   --   BILITOT 0.6  --   --   PROT 5.6*  --   --   ALBUMIN 1.7* 1.4* 1.4*   No results for input(s): LIPASE, AMYLASE in the last 168 hours. No results for input(s): AMMONIA in the last 168 hours.  CBC: Recent Labs  Lab 10/20/18 0514 10/20/18 1244 10/21/18 2331 10/22/18 1523 10/23/18 0313  WBC 45.5* 34.9* 34.9* 34.1* 39.7*  NEUTROABS  --  31.4*  --   --   --   HGB 8.8* 7.4* 7.4* 7.1* 7.6*  HCT 28.9* 23.2* 24.0* 22.6* 24.4*  MCV 101.8* 100.0 100.8* 97.4 99.2  PLT 439* 374 303 265 276    Cardiac Enzymes: No results for input(s): CKTOTAL, CKMB, CKMBINDEX, TROPONINI in the last 168 hours.  BNP (last 3 results) Recent Labs    10/01/18 0600  BNP 593.8*    ProBNP (last 3 results) No results for input(s): PROBNP in the last 8760 hours.  Radiological Exams: Ir Cyndy Freeze Guide Cv Line Right  Result Date: 10/22/2018 INDICATION: 63 year old with renal failure and needing dialysis. EXAM: FLUOROSCOPIC AND ULTRASOUND GUIDED  PLACEMENT OF A NON-TUNNELED DIALYSIS CATHETER Physician: Stephan Minister. Henn, MD MEDICATIONS: None ANESTHESIA/SEDATION: None FLUOROSCOPY TIME:  Fluoroscopy Time: 24 seconds, 4 mGy COMPLICATIONS: None immediate. PROCEDURE: Informed consent was obtained for catheter placement. The patient was placed supine on the interventional table. Ultrasound confirmed a patent right internal jugular vein. Ultrasound images were obtained for documentation. The right side of the neck was prepped and draped in a sterile fashion. The right neck was anesthetized with 1% lidocaine. Maximal barrier sterile technique was utilized including caps, mask, sterile gowns, sterile gloves, sterile drape, hand hygiene and skin antiseptic. A small incision was made with #11 blade scalpel. A 21 gauge needle directed into the right internal jugular vein with ultrasound guidance. A micropuncture dilator set was placed. A 16 cm  Mahurkar catheter was selected. The catheter was advanced over a wire and positioned in the lower SVC. Fluoroscopic images were obtained for documentation. Both dialysis lumens were found to aspirate and flush well. The proper amount of heparin was flushed in both lumens. The central venous lumen was flushed with normal saline. Catheter was sutured to skin. FINDINGS: Catheter tip in the lower SVC. IMPRESSION: Successful placement of a right jugular non-tunneled dialysis catheter using ultrasound and fluoroscopic guidance. Electronically Signed   By: Markus Daft M.D.   On: 10/22/2018 17:20   Ir US Guide Vasc Access Right  Result Date: 10/22/2018 INDICATION: 63 year old with renal failure and needing dialysis. EXAM: FLUOROSCOPIC AND ULTRASOUND GUIDED PLACEMENT OF A NON-TUNNELED DIALYSIS CATHETER Physician: Stephan Minister. Henn, MD MEDICATIONS: None ANESTHESIA/SEDATION: None FLUOROSCOPY TIME:  Fluoroscopy Time: 24 seconds, 4 mGy COMPLICATIONS: None immediate. PROCEDURE: Informed consent was obtained for catheter placement. The patient was placed supine on the interventional table. Ultrasound confirmed a patent right internal jugular vein. Ultrasound images were obtained for documentation. The right side of the neck was prepped and draped in a sterile fashion. The right neck was anesthetized with 1% lidocaine. Maximal barrier sterile technique was utilized including caps, mask, sterile gowns, sterile gloves, sterile drape, hand hygiene and skin antiseptic. A small incision was made with #11 blade scalpel. A 21 gauge needle directed into the right internal jugular vein with ultrasound guidance. A micropuncture dilator set was placed. A 16 cm Mahurkar catheter was selected. The catheter was advanced over a wire and positioned in the lower SVC. Fluoroscopic images were obtained for documentation. Both dialysis lumens were found to aspirate and flush well. The proper amount of heparin was flushed in both lumens. The central  venous lumen was flushed with normal saline. Catheter was sutured to skin. FINDINGS: Catheter tip in the lower SVC. IMPRESSION: Successful placement of a right jugular non-tunneled dialysis catheter using ultrasound and fluoroscopic guidance. Electronically Signed   By: Markus Daft M.D.   On: 10/22/2018 17:20   Dg Chest Port 1 View  Result Date: 10/23/2018 CLINICAL DATA:  Respiratory failure. EXAM: PORTABLE CHEST 1 VIEW COMPARISON:  Chest x-ray dated October 20, 2018. FINDINGS: Interval placement of a right internal jugular dialysis catheter with the tip at the cavoatrial junction. Unchanged tracheostomy tube. Stable cardiomediastinal silhouette. Persistent interstitial and airspace opacities in the central and basilar left lung. Slightly improved aeration at the right lung base. No acute osseous abnormality. IMPRESSION: 1. Persistent left central and basilar airspace disease, concerning for pneumonia. 2. Improved aeration at the right lung base. Electronically Signed   By: Titus Dubin M.D.   On: 10/23/2018 07:09    Assessment/Plan Active Problems:   Acute on chronic respiratory failure  with hypoxia ([place])   Influenza A with pneumonia   Hepatic steatosis   Chronic diastolic heart failure (HCC)   Gastric perforation, acute   Melena   Heme positive stool   Acute blood loss anemia   1. Acute chronic respiratory failure with hypoxia continues upon ventilator at this time.  Continue to attempt weaning per protocol.  Continue supportive measures 2. Influenza A pneumonia treated continue supportive care 3. Chronic diastolic heart failure at baseline continue present management 4. Gastric perforation status post surgical repair continue to monitor   I have personally seen and evaluated the patient, evaluated laboratory and imaging results, formulated the assessment and plan and placed orders. The Patient requires high complexity decision making for assessment and support.  Case was discussed on  Rounds with the Respiratory Therapy Staff  Allyne Gee, MD [facility] Pulmonary Critical Care Medicine Sleep Medicine

## 2018-10-25 DIAGNOSIS — J9621 Acute and chronic respiratory failure with hypoxia: Secondary | ICD-10-CM | POA: Diagnosis not present

## 2018-10-25 DIAGNOSIS — K3189 Other diseases of stomach and duodenum: Secondary | ICD-10-CM | POA: Diagnosis not present

## 2018-10-25 DIAGNOSIS — K760 Fatty (change of) liver, not elsewhere classified: Secondary | ICD-10-CM | POA: Diagnosis not present

## 2018-10-25 DIAGNOSIS — I5032 Chronic diastolic (congestive) heart failure: Secondary | ICD-10-CM | POA: Diagnosis not present

## 2018-10-25 LAB — CBC
HCT: 20.9 % — ABNORMAL LOW (ref 36.0–46.0)
Hemoglobin: 6.8 g/dL — CL (ref 12.0–15.0)
MCH: 30.5 pg (ref 26.0–34.0)
MCHC: 32.5 g/dL (ref 30.0–36.0)
MCV: 93.7 fL (ref 80.0–100.0)
Platelets: 231 10*3/uL (ref 150–400)
RBC: 2.23 MIL/uL — ABNORMAL LOW (ref 3.87–5.11)
RDW: 17.4 % — ABNORMAL HIGH (ref 11.5–15.5)
WBC: 37 10*3/uL — ABNORMAL HIGH (ref 4.0–10.5)
nRBC: 0.5 % — ABNORMAL HIGH (ref 0.0–0.2)

## 2018-10-25 LAB — RENAL FUNCTION PANEL
Albumin: 1.3 g/dL — ABNORMAL LOW (ref 3.5–5.0)
Anion gap: 12 (ref 5–15)
BUN: 112 mg/dL — ABNORMAL HIGH (ref 8–23)
CO2: 16 mmol/L — ABNORMAL LOW (ref 22–32)
Calcium: 7.4 mg/dL — ABNORMAL LOW (ref 8.9–10.3)
Chloride: 89 mmol/L — ABNORMAL LOW (ref 98–111)
Creatinine, Ser: 3.11 mg/dL — ABNORMAL HIGH (ref 0.44–1.00)
GFR calc Af Amer: 18 mL/min — ABNORMAL LOW (ref >60)
GFR calc non Af Amer: 15 mL/min — ABNORMAL LOW (ref >60)
Glucose, Bld: 129 mg/dL — ABNORMAL HIGH (ref 70–99)
Phosphorus: 5.8 mg/dL — ABNORMAL HIGH (ref 2.5–4.6)
Potassium: 3.2 mmol/L — ABNORMAL LOW (ref 3.5–5.1)
Sodium: 117 mmol/L — CL (ref 135–145)

## 2018-10-25 LAB — PREPARE RBC (CROSSMATCH)

## 2018-10-25 LAB — VANCOMYCIN, TROUGH: Vancomycin Tr: 24 ug/mL (ref 15–20)

## 2018-10-25 NOTE — Progress Notes (Addendum)
Pulmonary Critical Care Medicine [place]   PULMONARY CRITICAL CARE SERVICE  PROGRESS NOTE  Date of Service:   Pamela Curry  KZL:[id number]  DOB: May 20, 1956   DOA: 09/28/2018  Referring Physician: Merton Border, MD  HPI: Pamela Curry is a 63 y.o. female seen for follow up of Acute on Chronic Respiratory Failure.  Patient continues on full support at this time currently requiring 45% FiO2.  Satting well with no distress.  Medications: Reviewed on Rounds  Physical Exam:  Vitals: Pulse 91 respirations 25 BP 85/49 O2 sat 96% temp 97.9  Ventilator Settings ventilator mode AC PC rate of 18 expiratory pressure 18 PEEP of 5 FiO2 45  . General: Comfortable at this time . Eyes: Grossly normal lids, irises & conjunctiva . ENT: grossly tongue is normal . Neck: no obvious mass . Cardiovascular: S1 S2 normal no gallop . Respiratory: No rales or rhonchi noted . Abdomen: soft . Skin: no rash seen on limited exam . Musculoskeletal: not rigid . Psychiatric:unable to assess . Neurologic: no seizure no involuntary movements         Lab Data:   Basic Metabolic Panel: Recent Labs  Lab 10/20/18 0147  10/20/18 2139 10/21/18 0432 10/22/18 1523 10/23/18 0313 10/29/2018 0714  NA 147*   < > 142 139 130* 126* 117*  K 5.7*   < > 4.4 4.6 3.7 3.8 3.2*  CL 123*   < > 116* 112* 103 99 89*  CO2 13*   < > 16* 14* 14* 14* 16*  GLUCOSE 140*   < > 121* 104* 114* 151* 129*  BUN 107*   < > 109* 112* 114* 115* 112*  CREATININE 2.15*   < > 2.48* 2.68* 3.06* 3.22* 3.11*  CALCIUM 9.1   < > 8.1* 8.0* 7.9* 8.0* 7.4*  MG 2.5*  --   --   --   --   --   --   PHOS  --   --   --   --  6.4* 7.4* 5.8*   < > = values in this interval not displayed.    ABG: Recent Labs  Lab 10/20/18 0005 10/20/18 0429 10/20/18 0750 10/20/18 1415 10/23/18 2012  PHART 7.002* 7.056* 7.129* 7.146* 7.306*  PCO2ART 58.5* 47.1 45.9 42.6 31.0*  PO2ART 97.7 104 107 84.4 58.9*  HCO3 13.8* 12.6* 14.6*  14.1* 15.0*  O2SAT 96.0 97.5 97.8 96.4 90.9    Liver Function Tests: Recent Labs  Lab 10/20/18 1244 10/22/18 1523 10/23/18 0313 11/05/2018 0714  AST 32  --   --   --   ALT 11  --   --   --   ALKPHOS 193*  --   --   --   BILITOT 0.6  --   --   --   PROT 5.6*  --   --   --   ALBUMIN 1.7* 1.4* 1.4* 1.3*   No results for input(s): LIPASE, AMYLASE in the last 168 hours. No results for input(s): AMMONIA in the last 168 hours.  CBC: Recent Labs  Lab 10/20/18 1244 10/21/18 2331 10/22/18 1523 10/23/18 0313 11/18/2018 0714  WBC 34.9* 34.9* 34.1* 39.7* 37.0*  NEUTROABS 31.4*  --   --   --   --   HGB 7.4* 7.4* 7.1* 7.6* 6.8*  HCT 23.2* 24.0* 22.6* 24.4* 20.9*  MCV 100.0 100.8* 97.4 99.2 93.7  PLT 374 303 265 276 231    Cardiac Enzymes: No results for input(s): CKTOTAL, CKMB, CKMBINDEX, TROPONINI  in the last 168 hours.  BNP (last 3 results) Recent Labs    10/01/18 0600  BNP 593.8*    ProBNP (last 3 results) No results for input(s): PROBNP in the last 8760 hours.  Radiological Exams: No results found.  Assessment/Plan Active Problems:   Acute on chronic respiratory failure with hypoxia (HCC)   Influenza A with pneumonia   Hepatic steatosis   Chronic diastolic heart failure (HCC)   Gastric perforation, acute   Melena   Heme positive stool   Acute blood loss anemia   1. Acute on chronic respiratory failure with hypoxia continue home ventilator at this time.  Continue to attempt weaning per protocol.  Continue aggressive pulmonary toilet and supportive measures 2. Influenza A pneumonia treated continue supportive care 3. Chronic diastolic heart failure at baseline continue present management 4. Gastric perforation status post surgical repair continue to monitor   I have personally seen and evaluated the patient, evaluated laboratory and imaging results, formulated the assessment and plan and placed orders. The Patient requires high complexity decision making for  assessment and support.  Case was discussed on Rounds with the Respiratory Therapy Staff  Allyne Gee, MD [facility] Pulmonary Critical Care Medicine Sleep Medicine

## 2018-10-26 LAB — TYPE AND SCREEN
ABO/RH(D): A POS
Antibody Screen: NEGATIVE
Unit division: 0

## 2018-10-26 LAB — BPAM RBC
Blood Product Expiration Date: 202005052359
ISSUE DATE / TIME: 202005041330
Unit Type and Rh: 6200

## 2018-11-01 LAB — BLOOD GAS, ARTERIAL
Acid-base deficit: 13.2 mmol/L — ABNORMAL HIGH (ref 0.0–2.0)
Bicarbonate: 14.1 mmol/L — ABNORMAL LOW (ref 20.0–28.0)
FIO2: 0.4
MECHVT: 450 mL
O2 Saturation: 96.4 %
PEEP: 0.5 cmH2O
Patient temperature: 98.6
RATE: 24 resp/min
pCO2 arterial: 42.6 mmHg (ref 32.0–48.0)
pH, Arterial: 7.146 — CL (ref 7.350–7.450)
pO2, Arterial: 84.4 mmHg (ref 83.0–108.0)

## 2018-11-22 DEATH — deceased

## 2020-10-10 IMAGING — DX ABDOMEN - 1 VIEW
1 series · 1 of 1 positions shown · non-contrast
Comparison: None.

CLINICAL DATA: Tracheostomy in place. PEG status.

EXAM:
ABDOMEN - 1 VIEW

[abdomen]
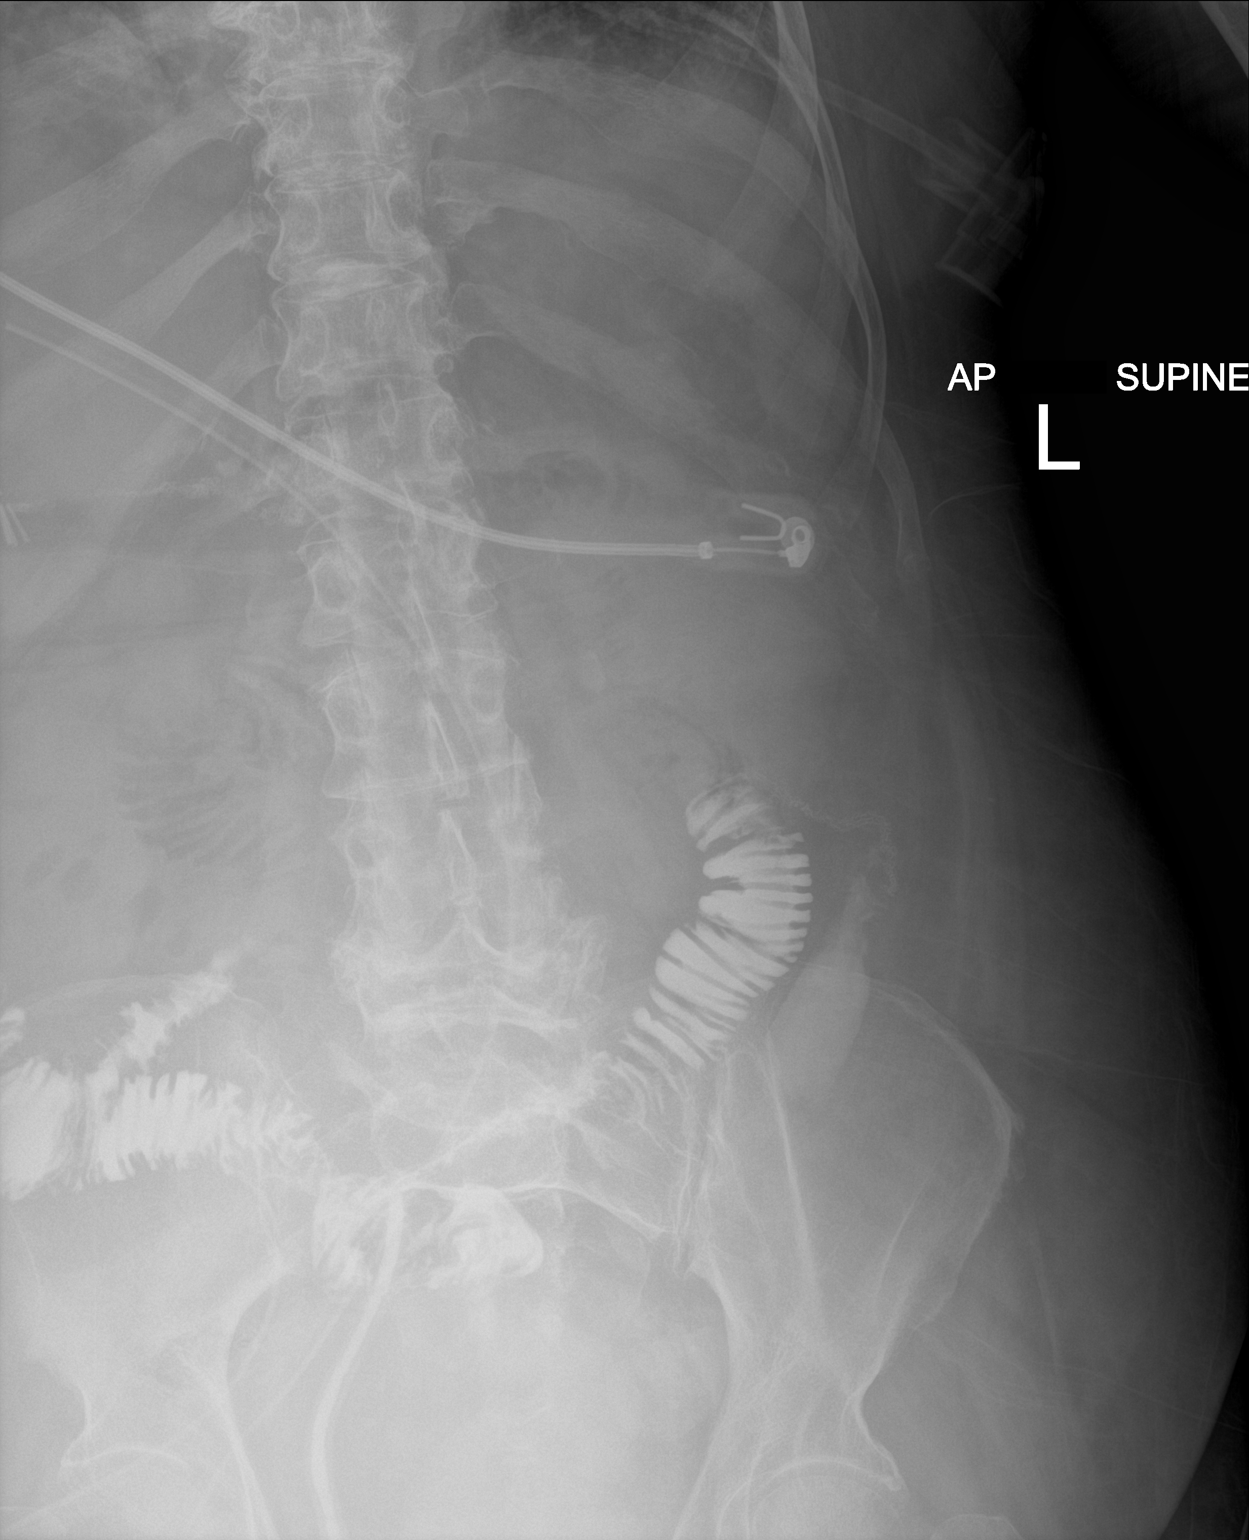

[1 of 1 positions shown; findings below may reference images not displayed]

FINDINGS: 30 cc Omnipaque administered through indwelling gastrostomy tube.
Contrast opacifies small bowel in the lower abdomen. No evidence of
extravasation or leak. Enteric chain sutures are noted. Otherwise
generalized paucity of bowel gas. Overlying monitoring devices.
IMPRESSION: Contrast opacifies small bowel in the lower abdomen, suggesting this
is a jejunostomy rather than a gastrostomy tube. No evidence of
extravasation or leak. Recommend correlation with procedural
history.

## 2020-10-15 IMAGING — CT CT ABDOMEN AND PELVIS WITH CONTRAST
2 of 5 series · 14 of 46 positions shown, 16 images · IV contrast (APPLIED)
Comparison: None.

CLINICAL DATA: Complex medical-surgical history. Repair of gastric
perforation in [REDACTED] with small-bowel resection, RIGHT hemicolectomy
with ileocolonic anastomosis followed several days later by a
splenic flexure takedown, colon resection with Hartmann's pouch,
ileostomy, jejunostomy revision and omentectomy. Now having GI
bleeding/anemia.

EXAM:
CT ABDOMEN AND PELVIS WITH CONTRAST
TECHNIQUE: Multidetector CT imaging of the abdomen and pelvis was performed
using the standard protocol following bolus administration of
intravenous contrast.
CONTRAST:  100mL OMNIPAQUE IOHEXOL 300 MG/ML  SOLN

[Series 3: abd/ pelvis 5.0 i30f 2 · axial · 0.98mm/px · z∈[+1119,+1564]mm · 11 of 101 slices shown, 13 images]
[im 6/101  soft-tissue]
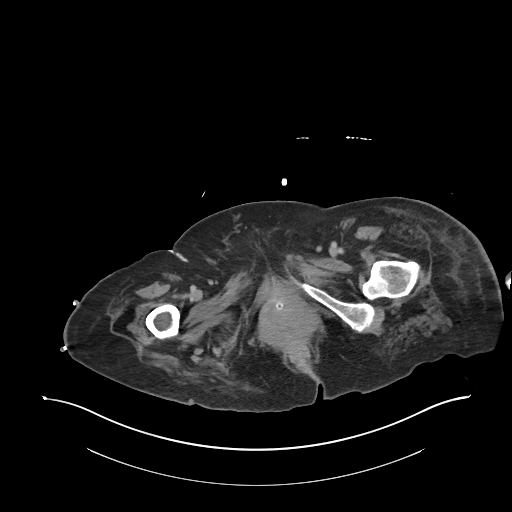
[im 6/101  bone]
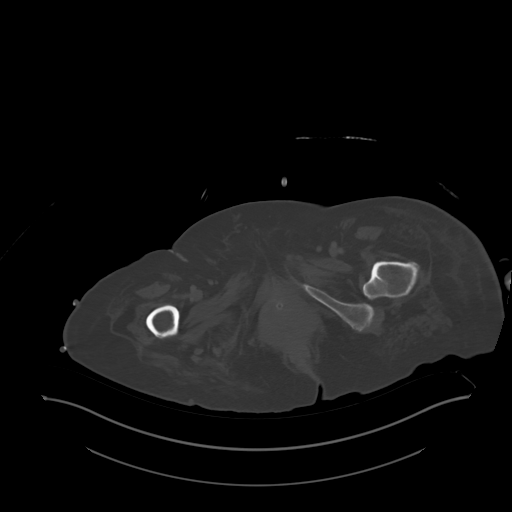
[im 18/101  soft-tissue]
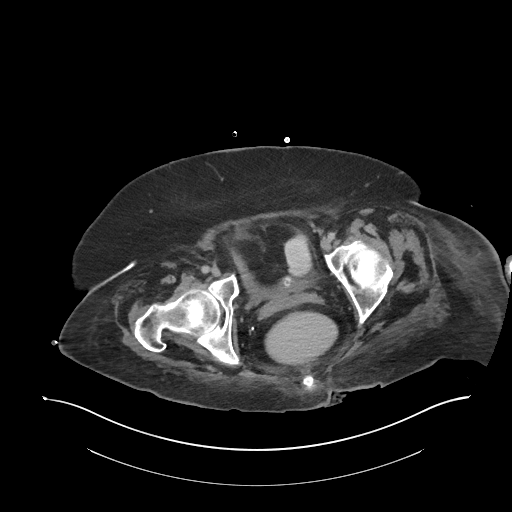
[im 24/101  soft-tissue]
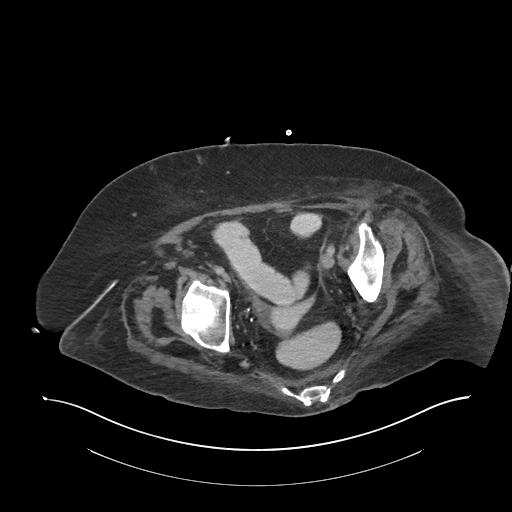
[im 36/101  soft-tissue]
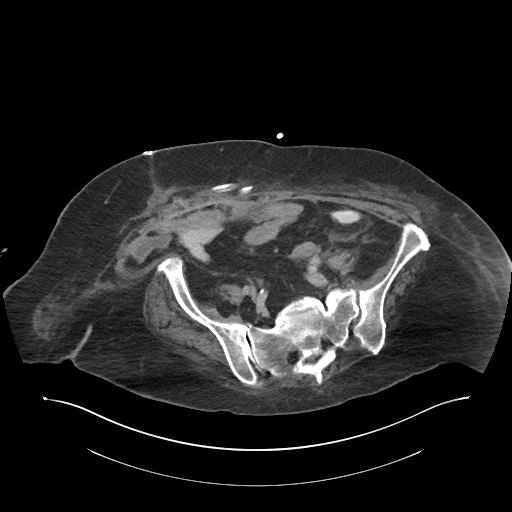
[im 42/101  soft-tissue]
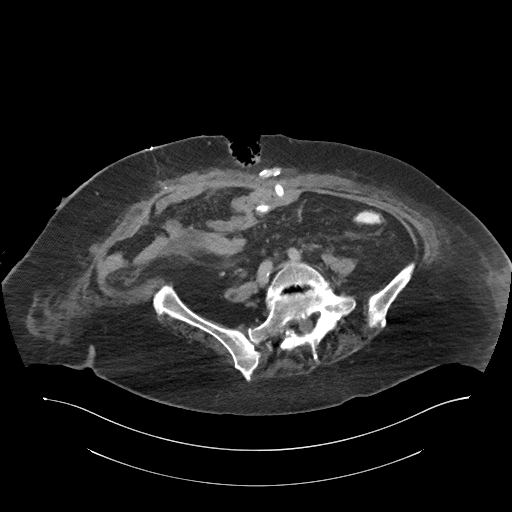
[im 53/101  soft-tissue]
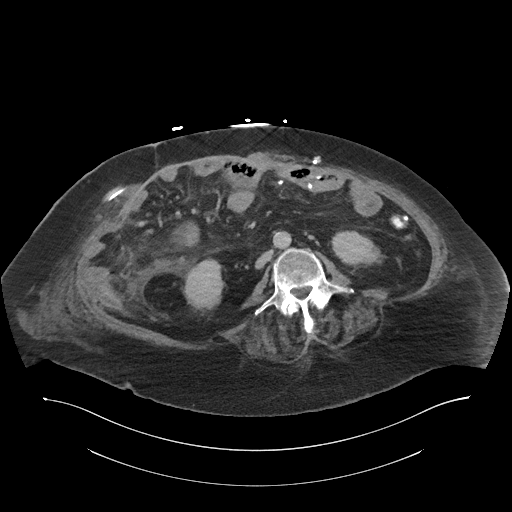
[im 59/101  soft-tissue]
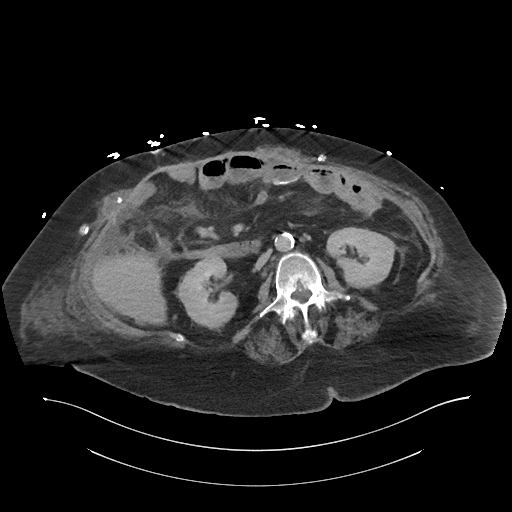
[im 65/101  soft-tissue]
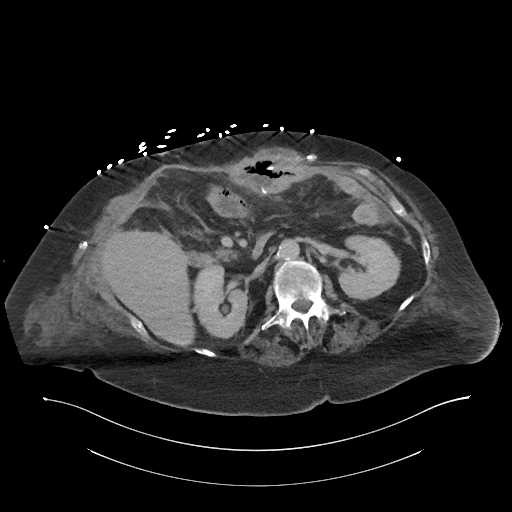
[im 77/101  soft-tissue]
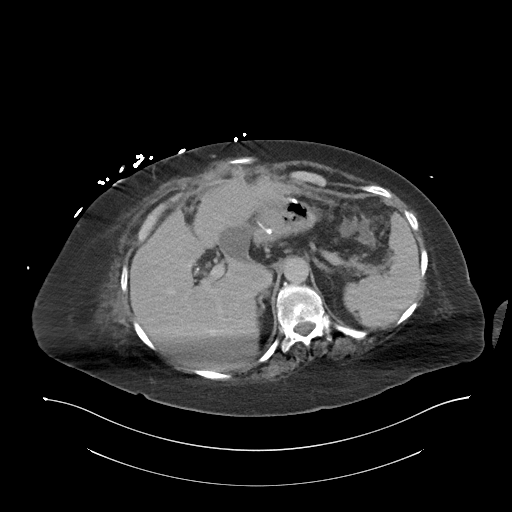
[im 77/101  bone]
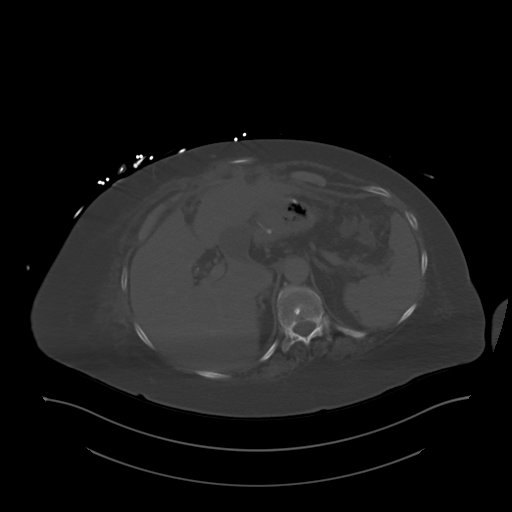
[im 83/101  soft-tissue]
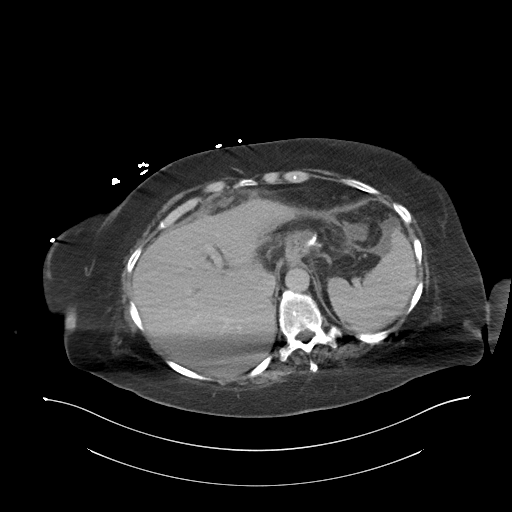
[im 95/101  soft-tissue]
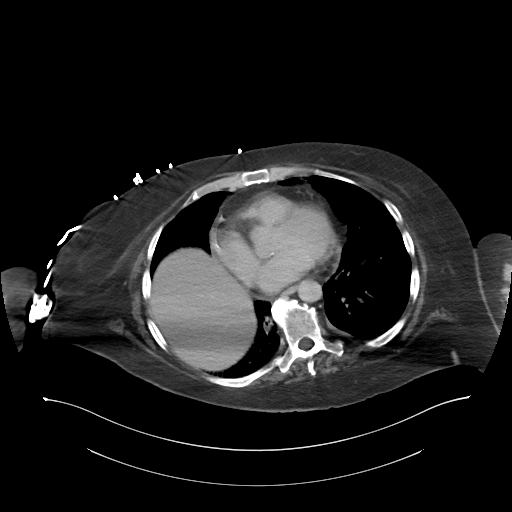

[Series 6: coronal soft tissue · coronal · 0.98mm/px · 3 of 101 slices shown]
[im 34/101  soft-tissue]
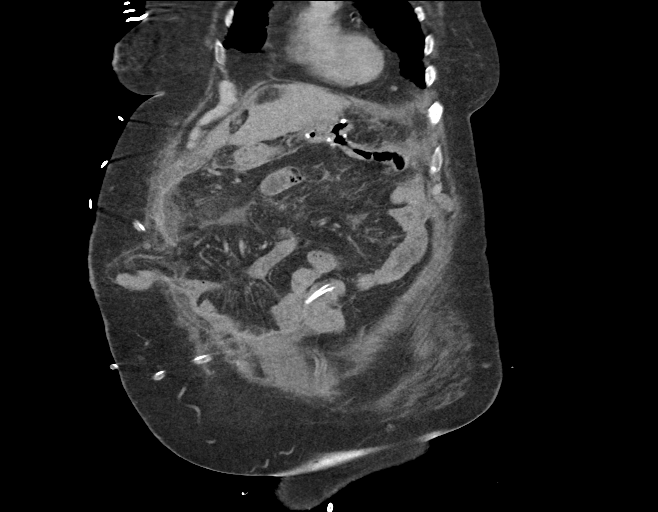
[im 45/101  soft-tissue]
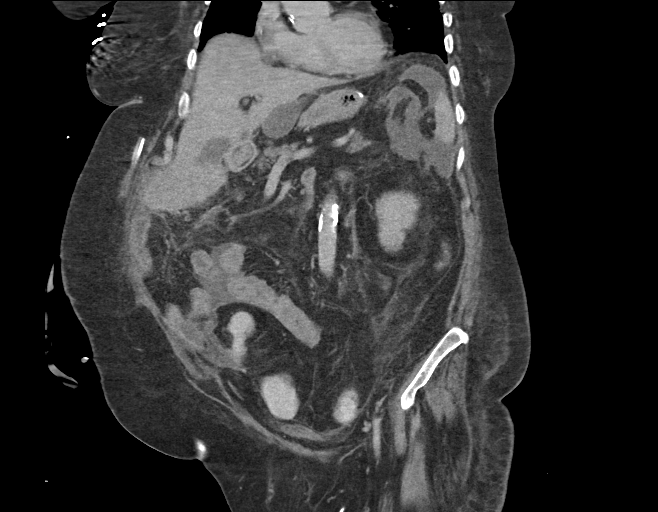
[im 56/101  soft-tissue]
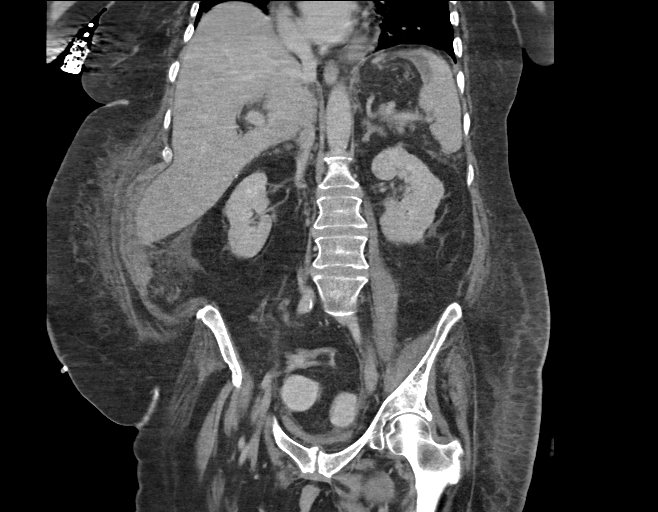

[14 of 46 positions shown; findings below may reference images not displayed]

FINDINGS: Lower chest: Small ill-defined consolidation at the medial aspects
of the RIGHT lung base, atelectasis versus pneumonia.

Hepatobiliary: Status post cholecystectomy. No focal liver
abnormality identified. Fluid collection within the gallbladder
fossa and along the undersurface of the LEFT hepatic lobe, measuring
approximately 5 x 2 cm and 6 x 3 cm respectively (axial series 3,
images 22 and 29).

Pancreas: Unremarkable. No pancreatic ductal dilatation or
surrounding inflammatory changes.

Spleen: Normal in size without focal abnormality.

Adrenals/Urinary Tract: Adrenal glands appear normal. Kidneys are
unremarkable without mass, stone or hydronephrosis. Bladder is
decompressed by Foley catheter.

Stomach/Bowel: Surgical changes of partial gastric resection with
gastrojejunostomy. Additional surgical changes of partial colectomy
at the level of the transverse colon. There is a RIGHT lower
quadrant ostomy. Percutaneous T shaped jejunostomy tube appears
appropriately positioned within the LEFT lower abdomen, with midline
anterior abdominal wall entrance.

No dilated bowel loops.

Bowel contrast to the level of the rectum is presumably residual
from a previous exam.

Vascular/Lymphatic: Aortic atherosclerosis. No acute appearing
vascular abnormality. No active hemorrhage/contrast extravasation
identified.

No enlarged lymph nodes seen in the abdomen or pelvis.

Reproductive: Presumed hysterectomy. No adnexal mass.

Other: Multiloculated fluid collection within the LEFT upper
quadrant, underlying the spleen, measuring approximately 6 cm
transverse dimension and approximately 10 cm craniocaudal dimension
(coronal series 6, image 46; axial series 3, image 30). These
collections appear near simple fluid density based on CT density
measurements.

No hyperdense fluid within the abdomen or pelvis to suggest acute or
recent hemorrhage. No free intraperitoneal air.

Musculoskeletal: No acute or suspicious osseous finding.
Degenerative changes throughout the thoracolumbar spine, mild to
moderate in degree.

Ill-defined edema within the superficial subcutaneous soft tissues
suggesting some degree of anasarca.

Two drainage catheters are present that terminate within the
subcutaneous soft tissues of the anterior abdominal wall and RIGHT
lateral abdominal wall.
IMPRESSION: 1. Multiloculated fluid collections within the left upper quadrant,
underlying the spleen, overall measuring approximately 10 x 6 cm,
with simple appearing fluid density, most suggestive of old hematoma
or seroma. No air within these collections or other confirming signs
of abscess. Similar appearing fluid collections in the gallbladder
fossa and underlying the LEFT liver lobe, with measurements given
above. Additional small amount of free fluid in the lower abdomen
and pelvis.
2. No active hemorrhage/contrast extravasation appreciated. No
evidence of acute or recent hemorrhage within the abdomen or pelvis.
3. Right lower quadrant ostomy, with additional surgical changes of
partial colectomy and gastrojejunostomy. No evidence of anastomotic
leak. No bowel obstruction. Oral contrast to the level of the rectum
is presumably residual from previous exam.
4. Percutaneous T shaped jejunostomy tube appears appropriately
positioned, with midline anterior abdominal wall entrance.
5. Small ill-defined consolidation at the medial aspects of the
right lung base, atelectasis versus pneumonia.
6. Probable anasarca.
7. Additional chronic/incidental findings detailed above.

Aortic Atherosclerosis ([id number]).
